# Patient Record
Sex: Male | Born: 1950
Health system: Southern US, Community
[De-identification: ages and names within clinical notes are randomized; demographics above are authoritative.]

## PROBLEM LIST (undated history)

## (undated) DIAGNOSIS — M1990 Unspecified osteoarthritis, unspecified site: Secondary | ICD-10-CM

## (undated) DIAGNOSIS — C801 Malignant (primary) neoplasm, unspecified: Secondary | ICD-10-CM

## (undated) DIAGNOSIS — I639 Cerebral infarction, unspecified: Secondary | ICD-10-CM

## (undated) DIAGNOSIS — H409 Unspecified glaucoma: Secondary | ICD-10-CM

## (undated) DIAGNOSIS — R911 Solitary pulmonary nodule: Secondary | ICD-10-CM

## (undated) DIAGNOSIS — G4733 Obstructive sleep apnea (adult) (pediatric): Secondary | ICD-10-CM

## (undated) DIAGNOSIS — I712 Thoracic aortic aneurysm, without rupture, unspecified: Secondary | ICD-10-CM

## (undated) DIAGNOSIS — C4491 Basal cell carcinoma of skin, unspecified: Secondary | ICD-10-CM

## (undated) DIAGNOSIS — I700 Atherosclerosis of aorta: Secondary | ICD-10-CM

## (undated) DIAGNOSIS — K759 Inflammatory liver disease, unspecified: Secondary | ICD-10-CM

## (undated) DIAGNOSIS — I10 Essential (primary) hypertension: Secondary | ICD-10-CM

## (undated) DIAGNOSIS — R5383 Other fatigue: Secondary | ICD-10-CM

## (undated) DIAGNOSIS — I2510 Atherosclerotic heart disease of native coronary artery without angina pectoris: Secondary | ICD-10-CM

## (undated) DIAGNOSIS — E785 Hyperlipidemia, unspecified: Secondary | ICD-10-CM

## (undated) DIAGNOSIS — I77810 Thoracic aortic ectasia: Secondary | ICD-10-CM

## (undated) DIAGNOSIS — I219 Acute myocardial infarction, unspecified: Secondary | ICD-10-CM

## (undated) DIAGNOSIS — Z8679 Personal history of other diseases of the circulatory system: Secondary | ICD-10-CM

## (undated) HISTORY — DX: Atherosclerosis of aorta: I70.0

## (undated) HISTORY — DX: Essential (primary) hypertension: I10

## (undated) HISTORY — PX: BASAL CELL CARCINOMA EXCISION: SHX1214

## (undated) HISTORY — DX: Inflammatory liver disease, unspecified: K75.9

## (undated) HISTORY — DX: Acute myocardial infarction, unspecified: I21.9

## (undated) HISTORY — DX: Basal cell carcinoma of skin, unspecified: C44.91

## (undated) HISTORY — DX: Hyperlipidemia, unspecified: E78.5

## (undated) HISTORY — DX: Unspecified glaucoma: H40.9

## (undated) HISTORY — DX: Solitary pulmonary nodule: R91.1

## (undated) HISTORY — DX: Thoracic aortic aneurysm, without rupture: I71.2

## (undated) HISTORY — DX: Other fatigue: R53.83

## (undated) HISTORY — DX: Atherosclerotic heart disease of native coronary artery without angina pectoris: I25.10

## (undated) HISTORY — DX: Obstructive sleep apnea (adult) (pediatric): G47.33

## (undated) HISTORY — DX: Thoracic aortic ectasia: I77.810

## (undated) HISTORY — DX: Thoracic aortic aneurysm, without rupture, unspecified: I71.20

## (undated) HISTORY — DX: Personal history of other diseases of the circulatory system: Z86.79

---

## 1982-12-04 HISTORY — PX: SEPTOPLASTY: SUR1290

## 1996-12-04 HISTORY — PX: UMBILICAL HERNIA REPAIR: SHX196

## 1999-06-02 ENCOUNTER — Encounter: Payer: Self-pay | Admitting: Pulmonary Disease

## 1999-06-02 ENCOUNTER — Ambulatory Visit: Admission: RE | Admit: 1999-06-02 | Discharge: 1999-06-02 | Payer: Self-pay | Admitting: Pulmonary Disease

## 1999-10-10 ENCOUNTER — Encounter: Admission: RE | Admit: 1999-10-10 | Discharge: 1999-10-10 | Payer: Self-pay | Admitting: Family Medicine

## 1999-10-10 ENCOUNTER — Encounter: Payer: Self-pay | Admitting: Family Medicine

## 2000-09-06 ENCOUNTER — Ambulatory Visit (HOSPITAL_COMMUNITY): Admission: RE | Admit: 2000-09-06 | Discharge: 2000-09-06 | Payer: Self-pay | Admitting: Urology

## 2000-09-06 ENCOUNTER — Encounter: Payer: Self-pay | Admitting: Urology

## 2000-09-11 ENCOUNTER — Encounter: Admission: RE | Admit: 2000-09-11 | Discharge: 2000-09-11 | Payer: Self-pay | Admitting: Urology

## 2000-09-11 ENCOUNTER — Encounter: Payer: Self-pay | Admitting: Urology

## 2003-08-26 ENCOUNTER — Observation Stay (HOSPITAL_COMMUNITY): Admission: RE | Admit: 2003-08-26 | Discharge: 2003-08-27 | Payer: Self-pay | Admitting: Surgery

## 2005-12-04 HISTORY — PX: CORONARY ANGIOPLASTY WITH STENT PLACEMENT: SHX49

## 2006-07-26 ENCOUNTER — Ambulatory Visit: Payer: Self-pay | Admitting: Cardiology

## 2006-08-01 ENCOUNTER — Ambulatory Visit: Payer: Self-pay | Admitting: Cardiology

## 2006-08-02 ENCOUNTER — Ambulatory Visit: Payer: Self-pay

## 2006-08-02 ENCOUNTER — Ambulatory Visit: Payer: Self-pay | Admitting: Cardiology

## 2006-08-09 ENCOUNTER — Ambulatory Visit: Payer: Self-pay | Admitting: Cardiology

## 2006-08-27 ENCOUNTER — Ambulatory Visit: Payer: Self-pay | Admitting: Cardiology

## 2006-08-30 ENCOUNTER — Ambulatory Visit: Payer: Self-pay | Admitting: Cardiology

## 2006-10-05 ENCOUNTER — Ambulatory Visit: Payer: Self-pay | Admitting: Cardiology

## 2007-01-09 ENCOUNTER — Ambulatory Visit: Payer: Self-pay | Admitting: Cardiology

## 2007-06-03 ENCOUNTER — Encounter: Payer: Self-pay | Admitting: Pulmonary Disease

## 2007-06-03 ENCOUNTER — Ambulatory Visit: Payer: Self-pay | Admitting: Cardiology

## 2007-08-23 ENCOUNTER — Ambulatory Visit: Payer: Self-pay | Admitting: Cardiology

## 2007-08-29 ENCOUNTER — Encounter: Payer: Self-pay | Admitting: Cardiology

## 2007-08-29 ENCOUNTER — Ambulatory Visit: Payer: Self-pay

## 2007-08-29 ENCOUNTER — Ambulatory Visit: Payer: Self-pay | Admitting: Cardiology

## 2007-10-22 ENCOUNTER — Ambulatory Visit: Payer: Self-pay | Admitting: Cardiology

## 2008-03-11 ENCOUNTER — Encounter: Admission: RE | Admit: 2008-03-11 | Discharge: 2008-03-11 | Payer: Self-pay | Admitting: Family Medicine

## 2008-03-26 ENCOUNTER — Encounter: Admission: RE | Admit: 2008-03-26 | Discharge: 2008-03-26 | Payer: Self-pay | Admitting: Family Medicine

## 2008-03-28 ENCOUNTER — Encounter: Payer: Self-pay | Admitting: Pulmonary Disease

## 2008-03-28 DIAGNOSIS — E785 Hyperlipidemia, unspecified: Secondary | ICD-10-CM | POA: Insufficient documentation

## 2008-03-28 DIAGNOSIS — K759 Inflammatory liver disease, unspecified: Secondary | ICD-10-CM | POA: Insufficient documentation

## 2008-03-28 DIAGNOSIS — I10 Essential (primary) hypertension: Secondary | ICD-10-CM | POA: Insufficient documentation

## 2008-03-28 DIAGNOSIS — G4733 Obstructive sleep apnea (adult) (pediatric): Secondary | ICD-10-CM | POA: Insufficient documentation

## 2008-03-31 ENCOUNTER — Encounter: Admission: RE | Admit: 2008-03-31 | Discharge: 2008-03-31 | Payer: Self-pay | Admitting: Family Medicine

## 2008-04-07 ENCOUNTER — Ambulatory Visit: Payer: Self-pay | Admitting: Pulmonary Disease

## 2008-04-07 DIAGNOSIS — I219 Acute myocardial infarction, unspecified: Secondary | ICD-10-CM | POA: Insufficient documentation

## 2008-04-29 ENCOUNTER — Ambulatory Visit: Payer: Self-pay

## 2008-05-09 ENCOUNTER — Encounter: Admission: RE | Admit: 2008-05-09 | Discharge: 2008-05-09 | Payer: Self-pay | Admitting: Neurosurgery

## 2009-03-30 ENCOUNTER — Encounter: Payer: Self-pay | Admitting: Cardiology

## 2009-04-06 ENCOUNTER — Ambulatory Visit: Payer: Self-pay | Admitting: Pulmonary Disease

## 2009-05-10 ENCOUNTER — Encounter: Payer: Self-pay | Admitting: Cardiology

## 2009-06-09 ENCOUNTER — Telehealth (INDEPENDENT_AMBULATORY_CARE_PROVIDER_SITE_OTHER): Payer: Self-pay | Admitting: *Deleted

## 2009-10-07 ENCOUNTER — Telehealth: Payer: Self-pay | Admitting: Cardiology

## 2009-10-07 ENCOUNTER — Encounter: Payer: Self-pay | Admitting: Cardiology

## 2009-10-08 ENCOUNTER — Telehealth (INDEPENDENT_AMBULATORY_CARE_PROVIDER_SITE_OTHER): Payer: Self-pay | Admitting: *Deleted

## 2009-10-11 ENCOUNTER — Ambulatory Visit: Payer: Self-pay | Admitting: Pulmonary Disease

## 2009-11-16 ENCOUNTER — Encounter: Payer: Self-pay | Admitting: Cardiology

## 2009-11-23 DIAGNOSIS — I2510 Atherosclerotic heart disease of native coronary artery without angina pectoris: Secondary | ICD-10-CM | POA: Insufficient documentation

## 2009-11-23 DIAGNOSIS — R5383 Other fatigue: Secondary | ICD-10-CM

## 2009-11-23 DIAGNOSIS — R5381 Other malaise: Secondary | ICD-10-CM | POA: Insufficient documentation

## 2009-11-24 DIAGNOSIS — Z8679 Personal history of other diseases of the circulatory system: Secondary | ICD-10-CM | POA: Insufficient documentation

## 2009-12-06 ENCOUNTER — Ambulatory Visit: Payer: Self-pay | Admitting: Cardiology

## 2010-04-07 ENCOUNTER — Telehealth: Payer: Self-pay | Admitting: Pulmonary Disease

## 2010-04-15 ENCOUNTER — Ambulatory Visit: Payer: Self-pay | Admitting: Pulmonary Disease

## 2010-06-15 ENCOUNTER — Telehealth (INDEPENDENT_AMBULATORY_CARE_PROVIDER_SITE_OTHER): Payer: Self-pay | Admitting: *Deleted

## 2010-06-16 ENCOUNTER — Encounter: Payer: Self-pay | Admitting: Cardiology

## 2010-07-22 ENCOUNTER — Ambulatory Visit: Payer: Self-pay | Admitting: Cardiology

## 2010-09-21 ENCOUNTER — Encounter: Payer: Self-pay | Admitting: Cardiology

## 2010-12-12 ENCOUNTER — Encounter
Admission: RE | Admit: 2010-12-12 | Discharge: 2010-12-12 | Payer: Self-pay | Source: Home / Self Care | Attending: Psychiatry | Admitting: Psychiatry

## 2010-12-25 ENCOUNTER — Encounter: Payer: Self-pay | Admitting: Neurosurgery

## 2010-12-25 ENCOUNTER — Encounter: Payer: Self-pay | Admitting: Orthopedic Surgery

## 2011-01-03 NOTE — Assessment & Plan Note (Signed)
Summary: f70m   Visit Type:  6 months follow up Primary Provider:  Dr Katy Fitch  CC:  Some palpitations in the morning most of the time.  History of Present Illness: Not having any chest pain.  Sometimes has some palpitations early in the morning, but infrequent.   Still not able to do a great deal at this point.  The neurologist told him not to do to much.  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  He has been restricted by the pain level in his back.  Still uses ibuprofen on a regular basis.  Patient no longer saying Ok Anis, but now seeing Dossie Arbour.     Current Medications (verified): 1)  Ibuprofen 200 Mg Tabs (Ibuprofen) .... Take 1 Tablet By Mouth Three Times A Day 2)  Aspirin 81 Mg Tbec (Aspirin) .... Take One Tablet By Mouth Daily 3)  Multivitamins  Tabs (Multiple Vitamin) .... Take 1 Tablet By Mouth Once A Day 4)  Eql Coq10 300 Mg Caps (Coenzyme Q10) .... Take 1 Tablet By Mouth Once A Day 5)  Fish Oil 1000 Mg Caps (Omega-3 Fatty Acids) .... Take 1 Capsule By Mouth Two Times A Day 6)  Triamterene-Hctz 37.5-25 Mg Tabs (Triamterene-Hctz) .... Take 1 Tablet By Mouth Once A Day 7)  Cyclobenzaprine Hcl 10 Mg Tabs (Cyclobenzaprine Hcl) .... Take 1 Tab By Mouth At Bedtime 8)  Tramadol Hcl 50 Mg Tabs (Tramadol Hcl) .... Take 1 Tablet By Mouth Three Times A Day As Needed  Allergies: 1)  ! Sulfa  Vital Signs:  Patient profile:   60 year old male Height:      74 inches Weight:      252.50 pounds BMI:     32.54 Pulse rate:   102 / minute Pulse rhythm:   irregular Resp:     18 per minute BP sitting:   116 / 96  (left arm) Cuff size:   large  Vitals Entered By: Vikki Ports (July 22, 2010 4:01 PM)  Physical Exam  General:  Well developed, well nourished, in no acute distress. Head:  normocephalic and atraumatic Eyes:  PERRLA/EOM intact; conjunctiva and lids normal. Neck:  Neck supple, no JVD. No masses, thyromegaly or abnormal cervical nodes. Lungs:  Clear bilaterally to  auscultation and percussion. Heart:  Non-displaced PMI, chest non-tender; regular rate and rhythm, S1, S2 without murmurs, rubs or gallops. Carotid upstroke normal, no bruit. Normal abdominal aortic size, no bruits.   EKG  Procedure date:  07/22/2010  Findings:      ST.  Otherwise normal.    Impression & Recommendations:  Problem # 1:  CAD (ICD-414.00) stable at present time.  No new symptoms.  HR slightly elevated, and patients wife will check. His updated medication list for this problem includes:    Aspirin 81 Mg Tbec (Aspirin) .Marland Kitchen... Take one tablet by mouth daily  Problem # 2:  HYPERTENSION (ICD-401.9) BP is borderline.  Wife should bheck this as well.  His updated medication list for this problem includes:    Aspirin 81 Mg Tbec (Aspirin) .Marland Kitchen... Take one tablet by mouth daily    Triamterene-hctz 37.5-25 Mg Tabs (Triamterene-hctz) .Marland Kitchen... Take 1 tablet by mouth once a day  Problem # 3:  HYPERLIPIDEMIA (ICD-272.4) Not on statins.  will recheck lipid and liver.  Problem # 4:  SLEEP APNEA, OBSTRUCTIVE, SEVERE (ICD-327.23) Wears CPAP machine and does not miss.

## 2011-01-03 NOTE — Letter (Signed)
Summary: Letter to Dr. Riley Kill from Patient  Letter to Dr. Riley Kill from Patient   Imported By: Marylou Mccoy 05/05/2010 09:47:23  _____________________________________________________________________  External Attachment:    Type:   Image     Comment:   External Document

## 2011-01-03 NOTE — Assessment & Plan Note (Signed)
Summary: Tyler Bradley for osa   Primary Provider/Referring Provider:  Dr Katy Fitch  CC:  Pt is here for a f/u appt.   Pt states he is wearing his cpap machine every night.  Approx 6 hours per night.  Pt denied any complaints with mask or pressure. Marland Kitchen  History of Present Illness: the pt comes in today for f/u of his known osa.  He is wearing cpap compliantly, and reports no mask or pressure issues.  He is seeing neurology for his back, and the pain is improved.  This has allowed him to get to sleep faster and longer while wearing cpap.  He feels he is resting better, and denies alertness issues during the day.  Current Medications (verified): 1)  Ibuprofen 200 Mg Tabs (Ibuprofen) .... Take 1 Tablet By Mouth Three Times A Day 2)  Aspirin 81 Mg Tbec (Aspirin) .... Take One Tablet By Mouth Daily 3)  Multivitamins  Tabs (Multiple Vitamin) .... Take 1 Tablet By Mouth Once A Day 4)  Eql Coq10 300 Mg Caps (Coenzyme Q10) .... Take 1 Tablet By Mouth Once A Day 5)  Fish Oil 1000 Mg Caps (Omega-3 Fatty Acids) .... Take 1 Capsule By Mouth Two Times A Day 6)  Triamterene-Hctz 37.5-25 Mg Tabs (Triamterene-Hctz) .... Take 1 Tablet By Mouth Once A Day 7)  Cyclobenzaprine Hcl 10 Mg Tabs (Cyclobenzaprine Hcl) .... Take 1 Tab By Mouth At Bedtime 8)  Tramadol Hcl 50 Mg Tabs (Tramadol Hcl) .... Take 1 Tablet By Mouth Three Times A Day As Needed  Allergies (verified): 1)  ! Sulfa  Review of Systems      See HPI  Vital Signs:  Patient profile:   60 year old male Height:      74 inches Weight:      255 pounds BMI:     32.86 O2 Sat:      94 % Temp:     97.6 degrees F oral Pulse rate:   89 / minute BP sitting:   112 / 78  (left arm) Cuff size:   large  Vitals Entered By: Arman Filter LPN (Apr 15, 2010 9:33 AM) CC: Pt is here for a f/u appt.   Pt states he is wearing his cpap machine every night.  Approx 6 hours per night.  Pt denied any complaints with mask or pressure.  Comments Medications reviewed with  patient Arman Filter LPN  Apr 15, 2010 9:33 AM    Physical Exam  General:  ow male in nad Nose:  no skin breakdown or pressure necrosis from cpap mask Neurologic:  alert, not sleepy, moves all 4.   Impression & Recommendations:  Problem # 1:  SLEEP APNEA, OBSTRUCTIVE, SEVERE (ICD-327.23)  the pt is doing well with cpap, and since his back pain is improving, he has lost weight and is sleeping longer.  I have asked him to continue on cpap, and to keep working on weight loss. Will see him back in one year.  Medications Added to Medication List This Visit: 1)  Ibuprofen 200 Mg Tabs (Ibuprofen) .... Take 1 tablet by mouth three times a day 2)  Eql Coq10 300 Mg Caps (Coenzyme q10) .... Take 1 tablet by mouth once a day 3)  Triamterene-hctz 37.5-25 Mg Tabs (Triamterene-hctz) .... Take 1 tablet by mouth once a day 4)  Cyclobenzaprine Hcl 10 Mg Tabs (Cyclobenzaprine hcl) .... Take 1 tab by mouth at bedtime 5)  Tramadol Hcl 50 Mg Tabs (Tramadol hcl) .... Take 1  tablet by mouth three times a day as needed  Other Orders: Est. Patient Level II ([address])  Patient Instructions: 1)  cancel all upcoming apptms 2)  schedule followup with me in one year 3)  continue to work on weight loss

## 2011-01-03 NOTE — Progress Notes (Signed)
Summary: rsc nos appt  Phone Note Call from Patient   Caller: [email or web address]  Call For: Tehilla Coffel Summary of Call: Pt rsc nos from 5/4 to 5/13 @ 9:45a. Initial call taken by: Darletta Moll,  Apr 07, 2010 9:53 AM

## 2011-01-03 NOTE — Assessment & Plan Note (Signed)
Summary: ROV   Visit Type:  Follow-up Primary Provider:  Dr Katy Fitch  CC:  edema lower extremities and sob.  History of Present Illness: Patient is struggling.  His biggest problem has been his back and hips.  When his back is tight, he feels it in legs and ankles.  Patient has had MRI, and both Dr. Sherlean Foot and Newell Coral indicated to him that he was not worse if his hips or back was worse.  Dr. Sherlean Foot, per patient, wanted the patient to have physical therapy.  HIs low back bothers him the most.  He has not been on statins.  He has had some lower extremity edema.  Patient denies any ongoing chest pain of any significance.  Has had some shortness of breath, but he has not been exercising or active by any means in last few months.  He wakes up at night, and has to reposition body.   Patient has gained about seven pounds.  Will use ibuprofen, but is taking on average about 4 tabs per day.  Was on simvastatin, but Dr. Duaine Dredge took him off of.  He was lethargic when he was on simvastatin.  BP high today here, but at home it has been quite a bit lower in general.  Wears CPAP every night at home.     Current Medications (verified): 1)  Ibuprofen 200 Mg Tabs (Ibuprofen) .... As Needed 2)  Aspirin 81 Mg Tbec (Aspirin) .... Take One Tablet By Mouth Daily 3)  Multivitamins  Tabs (Multiple Vitamin) .... Take 1 Tablet By Mouth Once A Day 4)  Coq10 100 Mg Caps (Coenzyme Q10) .... Take 1 Capsule By Mouth Twice A Day 5)  Fish Oil 1000 Mg Caps (Omega-3 Fatty Acids) .... Take 1 Capsule By Mouth Two Times A Day 6)  Betamethasone Dipropionate 0.05 % Crea (Betamethasone Dipropionate) .... Apply 1-2 Times Daily  Allergies: 1)  ! Sulfa  Past History:  Past Medical History: Last updated: 11/23/2009 Current Problems:  CAD (ICD-414.00) HYPERTENSION (ICD-401.9) HYPERLIPIDEMIA (ICD-272.4) Hx of MI (ICD-410.90) FATIGUE (ICD-780.79) HEPATITIS (ICD-573.3) SLEEP APNEA, OBSTRUCTIVE, SEVERE (ICD-327.23) RHEUMATIC  FEVER, HX OF (ICD-V12.59)  Past Surgical History: Last updated: 11/23/2009 BAsel Cell Carcinoma removed from forearm  Repair of umbilical hernia  Family History: Last updated: 11/23/2009 Cancer: Father ( Hodgkins disease) paternal grandmother (brain tumor)  Mother died at age 46- complications during surgery.  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 Head:  normocephalic and atraumatic Eyes:  PERRLA/EOM intact; conjunctiva and lids normal. Neck:  Neck supple, no JVD. No masses, thyromegaly or abnormal cervical nodes.  No bruits. Chest Wall:  no deformities or breast masses noted Lungs:  Clear bilaterally to auscultation and percussion. Heart:  PMI non displaced.  Normal S1 and S2.  No diastolic murmurs.   Abdomen:  Bowel sounds positive; abdomen soft and non-tender without masses, organomegaly, or hernias noted. No hepatosplenomegaly. Msk:  Back normal, normal gait. Muscle strength and tone normal. Pulses:  pulses normal in all  4 extremities Extremities:  No clubbing or cyanosis. Neurologic:  Alert and oriented x 3.   EKG  Procedure date:  12/06/2009  Findings:      NSR.  WNL. g Impression & Recommendations:  Problem # 1:  CAD (ICD-414.00) No definite cardiac symptoms.  No chest pain currently.  Continue to monitor status.  He is by and large disabled from a combination of his overall medical status, inability to be active, and current situation.  Cannot exercise at all, and does not feel he can work.  I am inclined to agree. His updated medication  list for this problem includes:    Aspirin 81 Mg Tbec (Aspirin) .Marland Kitchen... Take one tablet by mouth daily  Orders: EKG w/ Interpretation ([address])  Problem # 2:  HYPERTENSION (ICD-401.9) BP is quite high today.  He needs to check.  He is on no meds, has OSA, and is taking NSAIDS in fairly high dose which are contributors.  Reviewed.  If BP remains high, he will likely require treatment.  Ortho issues have rendered him unable to walk, or exercise much, and in significant pain requiring therapies.  Weight has risen.  Should be treated.  Edema may also be secondary to NSAIDs. His updated medication list for this problem includes:    Aspirin 81 Mg Tbec (Aspirin) .Marland Kitchen... Take one tablet by mouth daily  Orders: EKG w/ Interpretation ([address])  Problem # 3:  HYPERLIPIDEMIA (ICD-272.4) Not on statins.  Taking Fish oil.  Suggested he have a lipid and liver profile done.  He will call us back.  Problem # 4:  SLEEP APNEA, OBSTRUCTIVE, SEVERE (ICD-327.23) Under treatment.  Patient Instructions: 1)  Your physician wants you to follow-up in: 6 MONTHS.  You will receive a reminder letter in the mail two months in advance. If you don't receive a letter, please call our office to schedule the follow-up appointment. 2)  Your physician recommends that you continue on your current medications as directed. Please refer to the Current Medication list given to you today. 3)  Your physician recommends that you return for a FASTING lipid and liverprofile--414.01, 272.0, 782.3 (Pt will call back to schedule)

## 2011-01-03 NOTE — Letter (Signed)
Summary: [employer]   Imported By: Marylou Mccoy 05/05/2010 09:37:18  _____________________________________________________________________  External Attachment:    Type:   Image     Comment:   External Document

## 2011-01-03 NOTE — Progress Notes (Signed)
Summary: Patient's Current Med List   Patient's Current Med List   Imported By: Roderic Ovens 10/06/2010 10:42:29  _____________________________________________________________________  External Attachment:    Type:   Image     Comment:   External Document

## 2011-01-03 NOTE — Progress Notes (Signed)
  Request Recieved from [facility] for records sent to [facility]  June 15, 2010 1:24 PM

## 2011-04-14 ENCOUNTER — Ambulatory Visit: Payer: Self-pay | Admitting: Pulmonary Disease

## 2011-04-18 ENCOUNTER — Encounter: Payer: Self-pay | Admitting: *Deleted

## 2011-04-18 ENCOUNTER — Encounter: Payer: Self-pay | Admitting: Pulmonary Disease

## 2011-04-18 NOTE — Letter (Signed)
October 22, 2007    Mosetta Putt, M.D.  [address] [place], [place] [address]   RE:  Tyler Bradley, Tyler Bradley  MRN:  [id number]  /  DOB:  1951-08-08   Dear Cindee Lame:   I had the pleasure of seeing Tyler Bradley today in the office in a  followup visit.  In general he has been stable.  His wife has been  checking his blood pressures on a regular basis off of blood pressure  medication, and he has been consistently 120/70.  It has not been  elevated.  He is somewhat higher today, and initially his blood pressure  was 150/100, and then 150/90.  Of importance, the patient had a followup  echocardiogram which demonstrated well-preserved left ventricular  function without segmental wall motion abnormalities.  There was minimal  dilatation of the left atrium.  The aortic root size was also minimally  enlarged.   Today on exam the blood pressure is 150/90 by me, and the pulse is 75.  The lung fields are clear and the cardiac rhythm is regular.  I did not  appreciate aortic regurgitation.   The electrocardiogram is entirely within normal limits.   We plan not to see Tyler for 6 months.  I have asked him to keep a close  eye on his blood pressure.  We will get a chest x-ray as this has not  been done in the past.  Should he have any  problems in the interim, he is to give [place] a call.  I have strongly  encouraged him to try to lose weight, and he has promised me he will try  to get down to 240 during the interim period of time.   Thanks for allowing me to share in his care.    Sincerely,      Tyler Bradley. Tyler Kill, MD, [facility]  Electronically Signed    TDS/MedQ  DD: 10/22/2007  DT: 10/22/2007  Job #: [id number]

## 2011-04-18 NOTE — Assessment & Plan Note (Signed)
[facility]                            CARDIOLOGY OFFICE NOTE   NAME:SCHOOLFIELDJavante, Nilsson                MRN:          [id number]  DATE:08/23/2007                            DOB:          [birth year]    Mr. Mcphillips is in for followup.  In general, his major complaint is  that of fatigue.   INCOMPLETE DICTATION.     Arturo Morton. Riley Kill, MD, [facility]  Electronically Signed    TDS/MedQ  DD: 08/23/2007  DT: 08/23/2007  Job #: [id number]

## 2011-04-18 NOTE — Letter (Signed)
August 23, 2007    Mosetta Putt, M.D.  [address] [place], [place] [address]   RE:  Tyler Bradley  MRN:  [id number]  /  DOB:  05/12/1951   Dear Cindee Lame,   I had the pleasure of seeing Tyler Bradley in the office today in  followup.  In general, he has been  stable.  He has had some weight gain  despite staying active and according to him, following a diet relatively  clearly.  He wonders whether some of the medicines could be responsible  for the symptoms.  Otherwise, he has gotten along reasonably well.   MEDICATIONS:  1. Lipitor 40 mg 1/2 tablet daily.  2. Plavix 75 mg daily.  3. Aspirin 81 mg daily.  4. Metoprolol ER 50 mg 1/2 daily.  5. Multivitamin daily.  6. Coenzyme Q10 1 daily.  7. Fish oil 2 b.i.d.   PHYSICAL EXAMINATION:  He is alert and oriented.  Blood pressure 130/80, pulse 64.  Carotid upstrokes are brisk.  The lung fields are clear.  The PMI is nondisplaced.  There is a normal first and second heart sound  without significant murmur.   Review of recent laboratory studies have suggested a cholesterol of 155  with an LDL of 88 and an HDL of 50.  BUN and creatinine are normal.   The electrocardiogram here today demonstrates sinus bradycardia,  otherwise within normal limits.   I suspect that the beta blocker could be contributing to his fatigue.  We are going to get a 2D echocardiogram, and if his LV function is  reasonably preserved and normal, we will stop the beta blocker.  In  order to manage his hypertension, I may recommend that we add Losartan  to his regimen.  I will see him back in two months and send you a copy  of the echocardiogram once we have it.  I appreciate the opportunity of  sharing in his care.    Sincerely,      Tyler Bradley. Tyler Kill, MD, [facility]  Electronically Signed    TDS/MedQ  DD: 08/23/2007  DT: 08/23/2007  Job #: [id number]

## 2011-04-18 NOTE — Assessment & Plan Note (Signed)
[facility]                            CARDIOLOGY OFFICE NOTE   NAME:Tyler Bradley                MRN:          [id number]  DATE:06/03/2007                            DOB:          [birth year]    Tyler Bradley is in for a follow-up visit.  I had an extensive discussion with  both him and his wife today.  In general, he has been feeling relatively  well.  He denies any ongoing chest pain but the biggest problem is that  he has rather significant fatigue.  He just feels tired all of the time.  His wife says that at times his blood pressure is somewhat low, although  the pressures here have been consistently in a fairly normal range.  By  me today it was 140/90.   CURRENT MEDICATIONS:  1. Lipitor 40 mg one-half tablet daily.  2. Plavix 75 mg daily.  3. Aspirin 325 mg daily.  4. Metoprolol ER 50 mg daily.   PHYSICAL EXAMINATION:  VITAL SIGNS:  The blood pressure is 124/84 with a  pulse of 62.  LUNGS:  The lung fields are clear.  CARDIAC:  The cardiac rhythm is regular.   Electrocardiogram demonstrates normal sinus rhythm, within normal  limits.   IMPRESSION:  1. Coronary artery disease, status post implantation of a non-drug-      eluting stent during acute coronary syndrome.  2. Fatigue, possibly related to beta blockade therapy.  3. Hypercholesterolemia, on lipid-lowering therapy with last LDL      cholesterol of 45.   RECOMMENDATIONS:  1. Return to clinic in August.  2. At that time Plavix can be discontinued.  3. Decrease aspirin to 81 mg daily.  4. Decrease metoprolol ER to 50 mg one-half tablet daily.   He is to let us know if his symptoms do or do not improve.     Tyler Bradley. Riley Kill, MD, [facility]  Electronically Signed    TDS/MedQ  DD: 06/03/2007  DT: 06/04/2007  Job #: [id number]

## 2011-04-21 NOTE — Op Note (Signed)
   NAME:  Tyler Bradley, Tyler Bradley                   ACCOUNT NO.:  [id number]   MEDICAL RECORD NO.:  [id number]                   PATIENT TYPE:  AMB   LOCATION:  DAY                                  FACILITY:  [facility]   PHYSICIAN:  Thornton Park. Daphine Deutscher, M.D.             DATE OF BIRTH:  [birth year]   DATE OF PROCEDURE:  08/26/2003  DATE OF DISCHARGE:                                 OPERATIVE REPORT   PREOPERATIVE DIAGNOSIS:  Umbilical hernia.   POSTOPERATIVE DIAGNOSIS:  Umbilical hernia.   PROCEDURE:  Repair of umbilical hernia with mesh.   SURGEON:  Thornton Park. Daphine Deutscher, M.D.   ANESTHESIA:  General endotracheal.   DESCRIPTION OF PROCEDURE:  Tyler Bradley was taken to room 10, given  general anesthesia. The umbilical region was clipped and then prepped with  Betadine, draped sterilely. A small infraumbilical curvilinear incision was  used and I dissected the umbilical skin off this obvious umbilical hernia. I  opened it and cut into the sac and was able to reduce properitoneal fat and  then tuck this back beneath the fascia. A small Davol bard patch (cortex  Marlex) was then easily fit into the approximately 2 cm defect. This was  sutured to the perimeter with seven sutures of #0 Prolene. This completely  obliterated the defect and he did wake up and strain a bit and tested the  repair which looked to be in good order. The area was infiltrated with 1%  lidocaine Marcaine mixture and was then closed with 4-0 Vicryl  subcutaneously, subcuticularly and with Benzoin and Steri-Strips. The  patient seemed to tolerate the procedure well and was taken to the recovery  room in satisfactory condition.                                               Thornton Park Daphine Deutscher, M.D.    MBM/MEDQ  D:  08/26/2003  T:  08/26/2003  Job:  [id number]

## 2011-04-24 ENCOUNTER — Ambulatory Visit: Payer: Self-pay | Admitting: Pulmonary Disease

## 2011-04-27 ENCOUNTER — Encounter: Payer: Self-pay | Admitting: Pulmonary Disease

## 2011-05-08 ENCOUNTER — Ambulatory Visit (INDEPENDENT_AMBULATORY_CARE_PROVIDER_SITE_OTHER): Payer: PRIVATE HEALTH INSURANCE | Admitting: Pulmonary Disease

## 2011-05-08 ENCOUNTER — Encounter: Payer: Self-pay | Admitting: Pulmonary Disease

## 2011-05-08 ENCOUNTER — Telehealth: Payer: Self-pay | Admitting: Internal Medicine

## 2011-05-08 VITALS — BP 134/80 | HR 69 | Temp 97.7°F | Ht 74.0 in | Wt 263.4 lb

## 2011-05-08 DIAGNOSIS — G4733 Obstructive sleep apnea (adult) (pediatric): Secondary | ICD-10-CM

## 2011-05-08 NOTE — Patient Instructions (Signed)
Continue with cpap, and keep up with mask changes and supplies. Work on weight loss followup with me in one year.  

## 2011-05-08 NOTE — Telephone Encounter (Signed)
This message was created in error

## 2011-05-08 NOTE — Assessment & Plan Note (Signed)
The pt is wearing cpap compliantly, and feels it continues to help his sleep and daytime alertness.  He is due for a new mask, and I have also asked him to keep up with supplies as well.  I have also encouraged him to work on weight loss.

## 2011-05-08 NOTE — Progress Notes (Signed)
  Subjective:    Patient ID: Tyler Bradley, male    DOB: Mar 23, 1951, 60 y.o.   MRN: [id number]  HPI The pt comes in today for f/u of his known osa.  He is wearing cpap compliantly, and has no issues with mask fit or pressure.  He is overdue for a new mask though.  He feels he is sleeping well, and denies any significant daytime alertness issues with inactivity.  He has not been able to lose weight, and he feels this is due to inactivity from back issues.    Review of Systems  Constitutional: Positive for fever and unexpected weight change.  HENT: Positive for congestion, rhinorrhea, sneezing, postnasal drip and sinus pressure. Negative for ear pain, nosebleeds, sore throat, trouble swallowing and dental problem.   Eyes: Positive for redness and itching.  Respiratory: Negative for cough, chest tightness, shortness of breath and wheezing.   Cardiovascular: Positive for palpitations and leg swelling.  Gastrointestinal: Negative for nausea and vomiting.  Genitourinary: Negative for dysuria.  Musculoskeletal: Negative for joint swelling.  Skin: Positive for rash.  Neurological: Negative for headaches.  Hematological: Bruises/bleeds easily.  Psychiatric/Behavioral: Negative for dysphoric mood. The patient is not nervous/anxious.        Objective:   Physical Exam Ow male in nad No skin breakdown or pressure necrosis from cpap mask No purulence or discharge noted from nares LE without edema, no cyanosis noted.  Alert, does not appear sleepy, moves all 4        Assessment & Plan:

## 2011-07-19 ENCOUNTER — Other Ambulatory Visit: Payer: Self-pay | Admitting: Rheumatology

## 2011-07-19 DIAGNOSIS — M064 Inflammatory polyarthropathy: Secondary | ICD-10-CM

## 2011-07-20 ENCOUNTER — Encounter: Payer: Self-pay | Admitting: Cardiology

## 2011-07-20 ENCOUNTER — Ambulatory Visit (INDEPENDENT_AMBULATORY_CARE_PROVIDER_SITE_OTHER): Payer: PRIVATE HEALTH INSURANCE | Admitting: Cardiology

## 2011-07-20 VITALS — BP 134/89 | HR 89 | Ht 74.0 in | Wt 256.0 lb

## 2011-07-20 DIAGNOSIS — G4733 Obstructive sleep apnea (adult) (pediatric): Secondary | ICD-10-CM

## 2011-07-20 DIAGNOSIS — I2510 Atherosclerotic heart disease of native coronary artery without angina pectoris: Secondary | ICD-10-CM

## 2011-07-20 DIAGNOSIS — E785 Hyperlipidemia, unspecified: Secondary | ICD-10-CM

## 2011-07-20 NOTE — Patient Instructions (Signed)
Your physician recommends that you schedule a follow-up appointment in: 1 year  

## 2011-07-22 NOTE — Progress Notes (Signed)
HPI:  Tyler Bradley is in for follow up.  Cardiac wise, he has remained stable.  He has had a variety of other issues which have been problematic for him.  He gets some shortness of breath with light exercise, but that has been likely from a rduction in his overall activity level.  He has been seen by both neurology and rheumatology.  He has seen a back surgeon, and while they say they can repair a PARS defecrt, this would be unlikely to resolve the bigger issues.  Also just saw Dr. Marry Guan.  He does at times take a nonsteroidal.  Also of note, he uses CPAP machine every night.     Current Outpatient Prescriptions  Medication Sig Dispense Refill  . aspirin 81 MG tablet Take 81 mg by mouth daily.        . betamethasone dipropionate (DIPROLENE) 0.05 % cream Apply topically as needed.        . Coenzyme Q10 300 MG CAPS Take 100 mg by mouth 2 (two) times daily.       . cyclobenzaprine (FLEXERIL) 10 MG tablet Take 10 mg by mouth at bedtime as needed.       . fish oil-omega-3 fatty acids 1000 MG capsule Take 1 g by mouth 2 (two) times daily.        Marland Kitchen ibuprofen (ADVIL,MOTRIN) 200 MG tablet Take 750 mg by mouth as needed.       . metoprolol succinate (TOPROL-XL) 25 MG 24 hr tablet Take 25 mg by mouth daily.        . Multiple Vitamin (MULTIVITAMIN) capsule Take 1 capsule by mouth daily.        Marland Kitchen topiramate (TOPAMAX) 50 MG tablet Take 50 mg by mouth daily.        . traMADol (ULTRAM) 50 MG tablet Take 50 mg by mouth 3 (three) times daily as needed.        . triamterene-hydrochlorothiazide (MAXZIDE-25) 37.5-25 MG per tablet Take 1 tablet by mouth daily.          Allergies  Allergen Reactions  . Sulfonamide Derivatives     Past Medical History  Diagnosis Date  . CAD (coronary artery disease)   . HTN (hypertension)   . Hyperlipemia   . Heart attack   . Fatigue   . Hepatitis   . OSA (obstructive sleep apnea)   . History of rheumatic fever     Past Surgical History  Procedure Date  . Basal  cell carcinoma excision     from forehead  . Umbilical hernia repair     Family History  Problem Relation Age of Onset  . Hodgkin's lymphoma Father   . Other Paternal Grandmother     Brain tumor    History   Social History  . Marital Status: Married    Spouse Name: N/A    Number of Children: 2  . Years of Education: N/A   Occupational History  .      Benefits consultant   Social History Main Topics  . Smoking status: Never Smoker   . Smokeless tobacco: Not on file  . Alcohol Use: Not on file  . Drug Use: Not on file  . Sexually Active: Not on file   Other Topics Concern  . Not on file   Social History Narrative  . No narrative on file    ROS: Please see the HPI.  All other systems reviewed and negative.  PHYSICAL EXAM:  BP 134/89  Pulse  89  Ht 6\' 2"  (1.88 m)  Wt 256 lb (116.121 kg)  BMI 32.87 kg/m2  General: Well developed, well nourished, in no acute distress. Head:  Normocephalic and atraumatic. Neck: no JVD Lungs: Clear to auscultation and percussion. Heart: Normal S1 and S2.  No murmur, rubs or gallops.  Abdomen:  Normal bowel sounds; soft; non tender; no organomegaly Pulses: Pulses normal in all 4 extremities. Extremities: No clubbing or cyanosis. No edema. Neurologic: Alert and oriented x 3.  EKG:  NSR.  WNL.    ASSESSMENT AND PLAN:

## 2011-07-23 NOTE — Assessment & Plan Note (Signed)
On chronic CPAP therapy, and uses it.

## 2011-07-23 NOTE — Assessment & Plan Note (Signed)
Has been off of statin during the evaluation of all of his muscle and joint aches issues.

## 2011-07-23 NOTE — Assessment & Plan Note (Addendum)
No current symptoms.  Currently on ASA.  Please see [facility] for details.  It was received externally as his procedure was done elsewhere.  Notes extract into overview.  Prior records not in [facility].

## 2011-07-31 ENCOUNTER — Ambulatory Visit
Admission: RE | Admit: 2011-07-31 | Discharge: 2011-07-31 | Disposition: A | Discharge status: Home / Self Care | Payer: PRIVATE HEALTH INSURANCE | Source: Ambulatory Visit | Attending: Rheumatology | Admitting: Rheumatology

## 2011-07-31 DIAGNOSIS — M064 Inflammatory polyarthropathy: Secondary | ICD-10-CM

## 2011-07-31 MED ORDER — GADOBENATE DIMEGLUMINE 529 MG/ML IV SOLN
20.0000 mL | Freq: Once | INTRAVENOUS | Status: AC | PRN
  Administered 2011-07-31: 20 mL via INTRAVENOUS

## 2011-11-04 HISTORY — PX: TOTAL HIP ARTHROPLASTY: SHX124

## 2012-04-03 HISTORY — PX: TOTAL HIP ARTHROPLASTY: SHX124

## 2012-04-12 ENCOUNTER — Ambulatory Visit (HOSPITAL_COMMUNITY)
Admission: RE | Admit: 2012-04-12 | Discharge: 2012-04-12 | Disposition: A | Discharge status: Home / Self Care | Payer: PRIVATE HEALTH INSURANCE | Source: Ambulatory Visit | Attending: Internal Medicine | Admitting: Internal Medicine

## 2012-04-12 DIAGNOSIS — M79609 Pain in unspecified limb: Secondary | ICD-10-CM | POA: Insufficient documentation

## 2012-04-12 DIAGNOSIS — M7989 Other specified soft tissue disorders: Secondary | ICD-10-CM | POA: Insufficient documentation

## 2012-04-12 DIAGNOSIS — R609 Edema, unspecified: Secondary | ICD-10-CM

## 2012-04-12 NOTE — Progress Notes (Signed)
VASCULAR LAB PRELIMINARY  PRELIMINARY  PRELIMINARY  PRELIMINARY  Left lower extremity venous duplex completed.    Preliminary report:  Left:  No evidence of DVT, superficial thrombosis, or Baker's cyst.  Kely Dohn D, RVS 04/12/2012, 6:15 PM

## 2012-04-16 ENCOUNTER — Ambulatory Visit (INDEPENDENT_AMBULATORY_CARE_PROVIDER_SITE_OTHER): Payer: PRIVATE HEALTH INSURANCE | Admitting: Cardiology

## 2012-04-16 ENCOUNTER — Encounter: Payer: Self-pay | Admitting: Cardiology

## 2012-04-16 VITALS — BP 118/80 | HR 84 | Ht 74.0 in | Wt 249.0 lb

## 2012-04-16 DIAGNOSIS — I2510 Atherosclerotic heart disease of native coronary artery without angina pectoris: Secondary | ICD-10-CM

## 2012-04-16 DIAGNOSIS — I10 Essential (primary) hypertension: Secondary | ICD-10-CM

## 2012-04-16 DIAGNOSIS — E785 Hyperlipidemia, unspecified: Secondary | ICD-10-CM

## 2012-04-16 LAB — BASIC METABOLIC PANEL
CO2: 26 mEq/L (ref 19–32)
Chloride: 101 mEq/L (ref 96–112)
Potassium: 3.9 mEq/L (ref 3.5–5.1)
Sodium: 137 mEq/L (ref 135–145)

## 2012-04-16 NOTE — Progress Notes (Signed)
HPI:  The patient is seen back in followup. The patient underwent orthopedic surgery at [facility]. In December he had a right hip replacement and had an entirely uncomplicated course. He had a second replacement on May 2. The epidural did not work and he took oxycodone every 3 hours until he was discharged. He returned to the emergency room with a fever and was admitted with presumed pneumonia although they decided the next day that he did not have that.  His wife was concerned about possible stroke and had him see Dr. Eloise Harman on the following Monday. He also had no energy and was subsequently seen again his oxygen saturation was okay his blood count apparently was okay as well he was sent to the hospital for the ultrasound of his legs, and there was no evidence of DVT.  He has not had chest pain, shortness of breath with exertion. As such his wife wanted him checked. He denies any current chest pain.  Current Outpatient Prescriptions  Medication Sig Dispense Refill  . Acetaminophen (TYLENOL 8 HOUR PO) Take 650 mg by mouth 2 (two) times daily.      Marland Kitchen aspirin 325 MG tablet Take 325 mg by mouth 2 (two) times daily.      . betamethasone dipropionate (DIPROLENE) 0.05 % cream Apply topically as needed.        . Coenzyme Q10 300 MG CAPS Take 100 mg by mouth daily.       . cyclobenzaprine (FLEXERIL) 10 MG tablet Take 10 mg by mouth at bedtime as needed.       . fish oil-omega-3 fatty acids 1000 MG capsule Take 1 g by mouth 2 (two) times daily.        . metoprolol succinate (TOPROL-XL) 25 MG 24 hr tablet Take 25 mg by mouth daily.        . Multiple Vitamin (MULTIVITAMIN) capsule Take 1 capsule by mouth daily.        . traMADol (ULTRAM) 50 MG tablet Take 50 mg by mouth 3 (three) times daily as needed.        . triamterene-hydrochlorothiazide (MAXZIDE-25) 37.5-25 MG per tablet Take 1 tablet by mouth daily.          Allergies  Allergen Reactions  . Sulfonamide Derivatives   . Tape    Adhesive tape leaves a rash    Past Medical History  Diagnosis Date  . CAD (coronary artery disease)   . HTN (hypertension)   . Hyperlipemia   . Heart attack   . Fatigue   . Hepatitis   . OSA (obstructive sleep apnea)   . History of rheumatic fever     Past Surgical History  Procedure Date  . Basal cell carcinoma excision     from forehead  . Umbilical hernia repair     Family History  Problem Relation Age of Onset  . Hodgkin's lymphoma Father   . Other Paternal Grandmother     Brain tumor    History   Social History  . Marital Status: Married    Spouse Name: N/A    Number of Children: 2  . Years of Education: N/A   Occupational History  .      Benefits consultant   Social History Main Topics  . Smoking status: Never Smoker   . Smokeless tobacco: Not on file  . Alcohol Use: Not on file  . Drug Use: Not on file  . Sexually Active: Not on file  Other Topics Concern  . Not on file   Social History Narrative  . No narrative on file    ROS: Please see the HPI.  All other systems reviewed and negative.  PHYSICAL EXAM:  BP 118/80  Pulse 84  Ht 6\' 2"  (1.88 m)  Wt 249 lb (112.946 kg)  BMI 31.97 kg/m2  General: Well developed, well nourished, in no acute distress. Head:  Normocephalic and atraumatic. Neck: no JVD Lungs: Clear to auscultation and percussion. Heart: Normal S1 and S2.  No murmur, rubs or gallops.  Abdomen:  Normal bowel sounds; soft; non tender; no organomegaly Pulses: Pulses normal in all 4 extremities. Extremities: No clubbing or cyanosis. No edema. Neurologic: Alert and oriented x 3.  EKG:  NSR.  WNL.   ASSESSMENT AND PLAN:

## 2012-04-16 NOTE — Patient Instructions (Signed)
Your physician recommends that you schedule a follow-up appointment as needed.   Your physician recommends that you have lab work today: BMP

## 2012-04-16 NOTE — Assessment & Plan Note (Signed)
At the current time he appears to be stable.  He has no chest pain and his ECG is normal.  He likely has just had a longer course with this procedure, but I did explain to them the nature of inflammation mediated events such as MI.  He does not need further workup at present, and I have encouraged him to stay active.  His wife is having a double mastectomy next week, so they also wondered if this could impact him.  For now we will continue to watch him closely.  Should he have new symptoms he is to call me.

## 2012-04-16 NOTE — Assessment & Plan Note (Signed)
Followed by Dr. Paterson.   

## 2012-04-16 NOTE — Assessment & Plan Note (Signed)
Controlled, but will check bmet.

## 2012-05-07 ENCOUNTER — Ambulatory Visit: Payer: PRIVATE HEALTH INSURANCE | Admitting: Pulmonary Disease

## 2012-06-18 ENCOUNTER — Ambulatory Visit (HOSPITAL_COMMUNITY): Payer: PRIVATE HEALTH INSURANCE

## 2012-06-18 ENCOUNTER — Other Ambulatory Visit (HOSPITAL_COMMUNITY): Payer: Self-pay | Admitting: Psychiatry

## 2012-06-18 DIAGNOSIS — I639 Cerebral infarction, unspecified: Secondary | ICD-10-CM

## 2012-06-20 ENCOUNTER — Ambulatory Visit (HOSPITAL_COMMUNITY): Payer: PRIVATE HEALTH INSURANCE | Attending: Cardiovascular Disease | Admitting: Radiology

## 2012-06-20 ENCOUNTER — Other Ambulatory Visit (HOSPITAL_COMMUNITY): Payer: PRIVATE HEALTH INSURANCE

## 2012-06-20 ENCOUNTER — Encounter (HOSPITAL_COMMUNITY): Payer: Self-pay

## 2012-06-20 DIAGNOSIS — I6789 Other cerebrovascular disease: Secondary | ICD-10-CM | POA: Insufficient documentation

## 2012-06-20 DIAGNOSIS — E785 Hyperlipidemia, unspecified: Secondary | ICD-10-CM | POA: Insufficient documentation

## 2012-06-20 DIAGNOSIS — I639 Cerebral infarction, unspecified: Secondary | ICD-10-CM

## 2012-06-20 DIAGNOSIS — R5383 Other fatigue: Secondary | ICD-10-CM | POA: Insufficient documentation

## 2012-06-20 DIAGNOSIS — R0989 Other specified symptoms and signs involving the circulatory and respiratory systems: Secondary | ICD-10-CM | POA: Insufficient documentation

## 2012-06-20 DIAGNOSIS — R0609 Other forms of dyspnea: Secondary | ICD-10-CM | POA: Insufficient documentation

## 2012-06-20 DIAGNOSIS — I252 Old myocardial infarction: Secondary | ICD-10-CM | POA: Insufficient documentation

## 2012-06-20 DIAGNOSIS — R5381 Other malaise: Secondary | ICD-10-CM | POA: Insufficient documentation

## 2012-06-20 DIAGNOSIS — I517 Cardiomegaly: Secondary | ICD-10-CM | POA: Insufficient documentation

## 2012-06-20 DIAGNOSIS — I10 Essential (primary) hypertension: Secondary | ICD-10-CM | POA: Insufficient documentation

## 2012-06-20 DIAGNOSIS — I2510 Atherosclerotic heart disease of native coronary artery without angina pectoris: Secondary | ICD-10-CM | POA: Insufficient documentation

## 2012-06-20 NOTE — Progress Notes (Signed)
Echocardiogram performed.  

## 2012-06-20 NOTE — Progress Notes (Signed)
Patient ID: Tyler Bradley, male   DOB: 09/02/1951, 61 y.o.   MRN: [id number] IV with 20 G angiocath (R) hand x 1 by Archie Patten Yount,CNMT-RT-N tolerated well, site unremarkable for Echo Bubble Study. Irean Hong, RN

## 2012-06-21 ENCOUNTER — Encounter (HOSPITAL_COMMUNITY): Payer: Self-pay | Admitting: Psychiatry

## 2012-08-02 ENCOUNTER — Ambulatory Visit (INDEPENDENT_AMBULATORY_CARE_PROVIDER_SITE_OTHER): Payer: PRIVATE HEALTH INSURANCE | Admitting: Cardiology

## 2012-08-02 ENCOUNTER — Encounter: Payer: Self-pay | Admitting: Cardiology

## 2012-08-02 VITALS — BP 120/78 | HR 72 | Ht 74.0 in | Wt 240.0 lb

## 2012-08-02 DIAGNOSIS — E785 Hyperlipidemia, unspecified: Secondary | ICD-10-CM

## 2012-08-02 DIAGNOSIS — I2510 Atherosclerotic heart disease of native coronary artery without angina pectoris: Secondary | ICD-10-CM

## 2012-08-02 DIAGNOSIS — I10 Essential (primary) hypertension: Secondary | ICD-10-CM

## 2012-08-02 NOTE — Assessment & Plan Note (Signed)
The patient is well controlled.  He will need labs done at Dr. Worthy Flank office at the appopriate time.

## 2012-08-02 NOTE — Assessment & Plan Note (Signed)
Will defer to Dr. Eloise Harman.

## 2012-08-02 NOTE — Assessment & Plan Note (Signed)
Not currently having an cardiac symptoms.  Continue medical therapy is warranted.

## 2012-08-02 NOTE — Progress Notes (Signed)
HPI:  Patient is being seen in followup today. From a cardiac standpoint, he is doing well. He was thought to have a stroke. He was seen in his eye grounds. He was seen by a neurologist and carotid Dopplers were done. He also had an MRI. He has not had any significant chest pain. He has regular followup with Dr. Jarold Motto on an annual basis, and I suspect that his laboratory studies are done at that time.  The patient was also noted to have glaucoma, has been placed on medications for this. In addition, his dosing of aspirin was increased. He doesn't know any specific symptoms at this time.  Current Outpatient Prescriptions  Medication Sig Dispense Refill  . Acetaminophen (TYLENOL 8 HOUR PO) Take 650 mg by mouth 2 (two) times daily. As needed      . aspirin 325 MG tablet Take 325 mg by mouth daily.       . betamethasone dipropionate (DIPROLENE) 0.05 % cream Apply topically as needed.        . Coenzyme Q10 300 MG CAPS Take 300 mg by mouth daily.       . cyclobenzaprine (FLEXERIL) 10 MG tablet Take 10 mg by mouth at bedtime as needed.       . fish oil-omega-3 fatty acids 1000 MG capsule Take 1 g by mouth 2 (two) times daily.        . metoprolol succinate (TOPROL-XL) 25 MG 24 hr tablet Take 25 mg by mouth daily.        . Multiple Vitamin (MULTIVITAMIN) capsule Take 1 capsule by mouth daily.        . Multiple Vitamins-Minerals (ICAPS PO) Take by mouth daily.      . traMADol (ULTRAM) 50 MG tablet Take 50 mg by mouth 3 (three) times daily as needed.        . travoprost, benzalkonium, (TRAVATAN) 0.004 % ophthalmic solution 1 drop at bedtime.      . triamterene-hydrochlorothiazide (MAXZIDE-25) 37.5-25 MG per tablet Take 1 tablet by mouth daily.          Allergies  Allergen Reactions  . Sulfonamide Derivatives   . Tape     Adhesive tape leaves a rash    Past Medical History  Diagnosis Date  . CAD (coronary artery disease)   . HTN (hypertension)   . Hyperlipemia   . Heart attack   . Fatigue    . Hepatitis   . OSA (obstructive sleep apnea)   . History of rheumatic fever     Past Surgical History  Procedure Date  . Basal cell carcinoma excision     from forehead  . Umbilical hernia repair     Family History  Problem Relation Age of Onset  . Hodgkin's lymphoma Father   . Other Paternal Grandmother     Brain tumor    History   Social History  . Marital Status: Married    Spouse Name: N/A    Number of Children: 2  . Years of Education: N/A   Occupational History  .      Benefits consultant   Social History Main Topics  . Smoking status: Never Smoker   . Smokeless tobacco: Not on file  . Alcohol Use: Not on file  . Drug Use: Not on file  . Sexually Active: Not on file   Other Topics Concern  . Not on file   Social History Narrative  . No narrative on file    ROS: Please  see the HPI.  All other systems reviewed and negative.  PHYSICAL EXAM:  BP 120/78  Pulse 72  Ht 6\' 2"  (1.88 m)  Wt 240 lb (108.863 kg)  BMI 30.81 kg/m2  General: Well developed, well nourished, in no acute distress. Head:  Normocephalic and atraumatic. Neck: no JVD.  No carotid bruits.   Lungs: Clear to auscultation and percussion. Heart: Normal S1 and S2.  No murmur, rubs or gallops.  Pulses: Pulses normal in all 4 extremities. Extremities: No clubbing or cyanosis. No edema. Neurologic: Alert and oriented x 3.  EKG:  NSR.  WNL.   ASSESSMENT AND PLAN:

## 2012-08-02 NOTE — Patient Instructions (Signed)
Your physician wants you to follow-up in: 6 MONTHS with Dr Stuckey.  You will receive a reminder letter in the mail two months in advance. If you don't receive a letter, please call our office to schedule the follow-up appointment.  Your physician recommends that you continue on your current medications as directed. Please refer to the Current Medication list given to you today.  

## 2013-01-27 ENCOUNTER — Ambulatory Visit (INDEPENDENT_AMBULATORY_CARE_PROVIDER_SITE_OTHER): Payer: PRIVATE HEALTH INSURANCE | Admitting: Cardiology

## 2013-01-27 ENCOUNTER — Encounter: Payer: Self-pay | Admitting: Cardiology

## 2013-01-27 VITALS — BP 110/86 | HR 64 | Ht 74.0 in | Wt 244.0 lb

## 2013-01-27 DIAGNOSIS — I2510 Atherosclerotic heart disease of native coronary artery without angina pectoris: Secondary | ICD-10-CM

## 2013-01-27 DIAGNOSIS — E785 Hyperlipidemia, unspecified: Secondary | ICD-10-CM

## 2013-01-27 DIAGNOSIS — I10 Essential (primary) hypertension: Secondary | ICD-10-CM

## 2013-01-27 LAB — LIPID PANEL
Cholesterol: 178 mg/dL (ref 0–200)
LDL Cholesterol: 107 mg/dL — ABNORMAL HIGH (ref 0–99)
Triglycerides: 154 mg/dL — ABNORMAL HIGH (ref 0.0–149.0)
VLDL: 30.8 mg/dL (ref 0.0–40.0)

## 2013-01-27 LAB — BASIC METABOLIC PANEL
BUN: 20 mg/dL (ref 6–23)
CO2: 30 mEq/L (ref 19–32)
Chloride: 102 mEq/L (ref 96–112)
Potassium: 3.7 mEq/L (ref 3.5–5.1)

## 2013-01-27 NOTE — Patient Instructions (Addendum)
Your physician wants you to follow-up in: 1 YEAR with Dr Excell Seltzer (previous pt of Dr Riley Kill). You will receive a reminder letter in the mail two months in advance. If you don't receive a letter, please call our office to schedule the follow-up appointment.  Your physician recommends that you have lab work today: LIPID and BMP  Your physician recommends that you continue on your current medications as directed. Please refer to the Current Medication list given to you today.

## 2013-01-28 NOTE — Assessment & Plan Note (Addendum)
No current symptoms and he seems to be doing well.  Continued medical therapy is warranted.  ASA dose was selected by neuro after thought of stroke.  Notably, no history or evidence of atrial fib.

## 2013-01-28 NOTE — Assessment & Plan Note (Signed)
We discussed options with regard to his BP meds.  He is stable at present.  If he wanted to stop one of the two, the best candidate would be his diuretic, and continue Beta blockers.  He should measure BP and if it goes up, then consider seriously the possibility of resuming his meds.  Desire for beta blockade discussed.

## 2013-01-28 NOTE — Assessment & Plan Note (Signed)
Unable to take statins.  Weight loss would be a good consideration for risk factor reduction.

## 2013-01-28 NOTE — Progress Notes (Signed)
HPI:  Overall he seems to be doing a whole lot better.  No chest pain or other major symptoms.  Mobility has improved dramatically with hip replacement.  Denies any major complaints.    Current Outpatient Prescriptions  Medication Sig Dispense Refill  . Acetaminophen (TYLENOL 8 HOUR PO) Take 650 mg by mouth 2 (two) times daily. As needed      . aspirin 325 MG tablet Take 325 mg by mouth daily.       . betamethasone dipropionate (DIPROLENE) 0.05 % cream Apply topically as needed.        . Cholecalciferol (VITAMIN D3) 5000 UNITS CAPS Take 5,000 Units by mouth daily.      . Coenzyme Q10 300 MG CAPS Take 300 mg by mouth daily.       Marland Kitchen etodolac (LODINE) 500 MG tablet Take 500 mg by mouth as needed.      . ferrous sulfate 325 (65 FE) MG tablet Take 325 mg by mouth daily with breakfast.      . fish oil-omega-3 fatty acids 1000 MG capsule Take 1 g by mouth 2 (two) times daily.        . meloxicam (MOBIC) 15 MG tablet USE AS NEEDED      . metoprolol succinate (TOPROL-XL) 25 MG 24 hr tablet Take 25 mg by mouth daily.        . Multiple Vitamin (MULTIVITAMIN) capsule Take 1 capsule by mouth daily.        . Multiple Vitamins-Minerals (ICAPS PO) Take by mouth daily.      . OMEGA-3 KRILL OIL PO Take by mouth. TAKE ONE CAPSULE BY MOUTH DAILY      . travoprost, benzalkonium, (TRAVATAN) 0.004 % ophthalmic solution 1 drop at bedtime.      . triamterene-hydrochlorothiazide (MAXZIDE-25) 37.5-25 MG per tablet Take 1 tablet by mouth daily.        . Vitamin D-Vitamin K (VITAMIN K2-VITAMIN D3 PO) TAKE ONE TABLET OF VITAMIN K-2, MENAQUINONE-7 50 MCG       No current facility-administered medications for this visit.    Allergies  Allergen Reactions  . Sulfonamide Derivatives   . Tape     Adhesive tape leaves a rash    Past Medical History  Diagnosis Date  . CAD (coronary artery disease)   . HTN (hypertension)   . Hyperlipemia   . Heart attack   . Fatigue   . Hepatitis   . OSA (obstructive sleep apnea)    . History of rheumatic fever     Past Surgical History  Procedure Laterality Date  . Basal cell carcinoma excision      from forehead  . Umbilical hernia repair      Family History  Problem Relation Age of Onset  . Hodgkin's lymphoma Father   . Other Paternal Grandmother     Brain tumor    History   Social History  . Marital Status: Married    Spouse Name: N/A    Number of Children: 2  . Years of Education: N/A   Occupational History  .      Benefits consultant   Social History Main Topics  . Smoking status: Never Smoker   . Smokeless tobacco: Not on file  . Alcohol Use: Not on file  . Drug Use: Not on file  . Sexually Active: Not on file   Other Topics Concern  . Not on file   Social History Narrative  . No narrative on file  ROS: Please see the HPI.  All other systems reviewed and negative.  PHYSICAL EXAM:  BP 110/86  Pulse 64  Ht 6\' 2"  (1.88 m)  Wt 244 lb (110.678 kg)  BMI 31.31 kg/m2  SpO2 96%  General: Well developed, well nourished, in no acute distress. Head:  Normocephalic and atraumatic. Neck: no JVD Lungs: Clear to auscultation and percussion. Heart: Normal S1 and S2.  No murmur, rubs or gallops.  Pulses: Pulses normal in all 4 extremities. Extremities: No clubbing or cyanosis. No edema. Neurologic: Alert and oriented x 3.  EKG:  NSR.  WNL.   ASSESSMENT AND PLAN:

## 2013-03-03 ENCOUNTER — Other Ambulatory Visit: Payer: Self-pay | Admitting: Internal Medicine

## 2013-03-03 DIAGNOSIS — M545 Low back pain, unspecified: Secondary | ICD-10-CM

## 2013-03-10 ENCOUNTER — Ambulatory Visit
Admission: RE | Admit: 2013-03-10 | Discharge: 2013-03-10 | Disposition: A | Discharge status: Home / Self Care | Payer: BC Managed Care – PPO | Source: Ambulatory Visit | Attending: Internal Medicine | Admitting: Internal Medicine

## 2013-03-10 DIAGNOSIS — M545 Low back pain, unspecified: Secondary | ICD-10-CM

## 2013-08-14 ENCOUNTER — Telehealth: Payer: Self-pay | Admitting: Cardiovascular Disease

## 2013-08-14 NOTE — Telephone Encounter (Signed)
Called patient's wife. She states that he has been complaining of numbness and tingling in his fingers on and off times several weeks. No other complaints. States that he has been doing a lot of typing lately without the use of an arm rest. Advised her to have him use a gel arm rest pad and if not better to see his PCP for this problem.  Patient's wife agreed to this plan.

## 2013-08-14 NOTE — Telephone Encounter (Signed)
Just told me he is having tingling in both arms and fingers.  He has a stent.

## 2014-01-15 ENCOUNTER — Encounter: Payer: Self-pay | Admitting: Cardiovascular Disease

## 2014-01-15 ENCOUNTER — Ambulatory Visit (INDEPENDENT_AMBULATORY_CARE_PROVIDER_SITE_OTHER): Payer: BC Managed Care – PPO | Admitting: Cardiovascular Disease

## 2014-01-15 VITALS — BP 140/89 | HR 83 | Ht 74.0 in | Wt 264.0 lb

## 2014-01-15 DIAGNOSIS — E785 Hyperlipidemia, unspecified: Secondary | ICD-10-CM

## 2014-01-15 DIAGNOSIS — I10 Essential (primary) hypertension: Secondary | ICD-10-CM

## 2014-01-15 DIAGNOSIS — I2510 Atherosclerotic heart disease of native coronary artery without angina pectoris: Secondary | ICD-10-CM

## 2014-01-15 MED ORDER — EZETIMIBE 10 MG PO TABS: 10.0000 mg | ORAL_TABLET | Freq: Every day | ORAL | Status: DC

## 2014-01-15 NOTE — Addendum Note (Signed)
Addended by: Emmaline Life on: 01/15/2014 05:48 PM   Modules accepted: Orders

## 2014-01-15 NOTE — Progress Notes (Signed)
HPI:  63 year old gentleman presenting for followup evaluation. He has been followed by Dr. Lia Foyer. This is his first encounter with me.  The patient has coronary artery disease. He presented in 2007 with acute coronary syndrome. He underwent cardiac catheterization in [facility]. At that time he was treated with overlapping bare-metal stents in his right coronary artery. He has his stent card with him and they were 4.0 mm MultiLink vision stents.  From a cardiac perspective the patient is doing well. He denies chest pain, chest pressure, shortness of breath, edema, or palpitations.  He is primarily limited by arthritis. The patient has had 2 hip replacements. Recently he has been complaining of joint swelling and pain as well as rash. He is concerned that he may have psoriatic arthritis. He is going to see his rheumatologist in the near future. The patient has been contemplating back surgery. He has concerns because he had a stroke after her hip replacement in 2013. He has no residual deficit.  His last lipids were one year ago and LDL was 107. He's been statin intolerant. He's only taking fish oil at this time.  Outpatient Encounter Prescriptions as of 01/15/2014  Medication Sig  . aspirin 325 MG tablet Take 325 mg by mouth daily.   . betamethasone dipropionate (DIPROLENE) 0.05 % cream Apply topically as needed.    . Cholecalciferol (VITAMIN D3) 5000 UNITS CAPS Take 5,000 Units by mouth daily.  . Coenzyme Q10 300 MG CAPS Take 300 mg by mouth 2 (two) times daily.   Marland Kitchen etodolac (LODINE) 500 MG tablet Take 500 mg by mouth as needed.  . ferrous sulfate 325 (65 FE) MG tablet Take 325 mg by mouth daily with breakfast.  . fish oil-omega-3 fatty acids 1000 MG capsule Take 1 g by mouth daily.   Marland Kitchen latanoprost (XALATAN) 0.005 % ophthalmic solution Place 1 drop into both eyes at bedtime.  . metoprolol succinate (TOPROL-XL) 25 MG 24 hr tablet Take 25 mg by mouth daily.    . Multiple  Vitamin (MULTIVITAMIN) capsule Take 1 capsule by mouth daily.    . Multiple Vitamins-Minerals (ICAPS PO) Take by mouth daily.  . OMEGA-3 KRILL OIL PO Take by mouth. TAKE ONE CAPSULE BY MOUTH DAILY  . topiramate (TOPAMAX) 50 MG tablet Take 50 mg by mouth daily.  . travoprost, benzalkonium, (TRAVATAN) 0.004 % ophthalmic solution 1 drop at bedtime.  . triamterene-hydrochlorothiazide (MAXZIDE-25) 37.5-25 MG per tablet Take 1 tablet by mouth daily.    Marland Kitchen ezetimibe (ZETIA) 10 MG tablet Take 1 tablet (10 mg total) by mouth daily.  . Vitamin D-Vitamin K (VITAMIN K2-VITAMIN D3 PO) TAKE ONE TABLET OF VITAMIN K-2, MENAQUINONE-7 50 MCG  . [DISCONTINUED] Acetaminophen (TYLENOL 8 HOUR PO) Take 650 mg by mouth 2 (two) times daily. As needed  . [DISCONTINUED] meloxicam (MOBIC) 15 MG tablet USE AS NEEDED    Allergies  Allergen Reactions  . Sulfonamide Derivatives   . Tape     Adhesive tape leaves a rash    Past Medical History  Diagnosis Date  . CAD (coronary artery disease)   . HTN (hypertension)   . Hyperlipemia   . Heart attack   . Fatigue   . Hepatitis   . OSA (obstructive sleep apnea)   . History of rheumatic fever     ROS: Negative except as per HPI  BP 140/89  Pulse 83  Ht 6\' 2"  (1.88 m)  Wt 264 lb (119.75 kg)  BMI 33.88 kg/m2  PHYSICAL EXAM: Pt is alert and oriented, pleasant overweight male in NAD HEENT: normal Neck: JVP - normal, carotids 2+= without bruits Lungs: CTA bilaterally CV: RRR without murmur or gallop Abd: soft, NT, Positive BS, no hepatomegaly Ext: no C/C/E, distal pulses intact and equal Skin: warm/dry no rash  EKG:  Normal sinus rhythm 86 beats per minute, moderate voltage criteria for LVH maybe normal variant.  ASSESSMENT AND PLAN: 1. Coronary artery disease, native vessel. The patient is stable without symptoms of angina. He takes aspirin and a beta blocker. He is on a lipid-lowering therapy. I will see him back in one year. I think his overall cardiac risk  of back surgery is low considering no anginal symptoms. Stress testing is not indicated.  2. Hypertension. Blood pressure is controlled on a combination of metoprolol succinate and triamterene/hydrochlorothiazide.  3. Hyperlipidemia. Lipids from one year ago were reviewed. His LDL is above goal. Recommended adding zetia 10 mg daily with followup lipids and LFTs in 3 months.  For followup I will see him back in one year unless problems arise.  Sherren Mocha 01/15/2014 5:40 PM

## 2014-01-15 NOTE — Patient Instructions (Addendum)
Your physician has recommended you make the following change in your medication:  START Zetia 10 mg Once Daily at bedtime  Your physician recommends that you return for lab work in: 3 months for lipid and liver panel  Your physician wants you to follow-up in: 1 year with Dr. Burt Knack.  You will receive a reminder letter in the mail two months in advance. If you don't receive a letter, please call our office to schedule the follow-up appointment.

## 2014-01-21 ENCOUNTER — Telehealth: Payer: Self-pay | Admitting: Nurse Practitioner

## 2014-01-21 NOTE — Telephone Encounter (Signed)
Spoke with patient and patient is agreeable to plan.  I scheduled him for lipid clinic on 2/25.

## 2014-01-21 NOTE — Telephone Encounter (Signed)
Recommend referral to the Lipid Clinic. Please offer my apology - had no idea Zetia would cost anywhere near that amount. thx

## 2014-01-21 NOTE — Telephone Encounter (Signed)
Called patient to schedule 3 month follow-up lab work since patient would be starting Zetia per ov 2/12 with Dr. Burt Knack.  Patient states that he went to pick up the Rx and the cost was $600.  Patient would like to know if there is a different medication that he can take in the place.  I advised patient that I will send message to Dr. Burt Knack for advice and will call him back with that information.  Patient verbalized understanding and agreement.

## 2014-01-28 ENCOUNTER — Ambulatory Visit: Payer: BC Managed Care – PPO | Admitting: Pharmacist

## 2014-02-03 ENCOUNTER — Ambulatory Visit (INDEPENDENT_AMBULATORY_CARE_PROVIDER_SITE_OTHER): Payer: BC Managed Care – PPO | Admitting: Pharmacist

## 2014-02-03 VITALS — Wt 264.0 lb

## 2014-02-03 DIAGNOSIS — Z79899 Other long term (current) drug therapy: Secondary | ICD-10-CM

## 2014-02-03 DIAGNOSIS — E785 Hyperlipidemia, unspecified: Secondary | ICD-10-CM

## 2014-02-03 MED ORDER — ROSUVASTATIN CALCIUM 10 MG PO TABS: ORAL_TABLET | ORAL | Status: DC

## 2014-02-03 NOTE — Progress Notes (Signed)
Patient is a pleasant 63 y.o. WM referred to [facility]id Clinic by Dr. Burt Knack given h/o statin failure in the setting of CAD.  LDL was 107 mg/dL in 01/2013 (last year) not on therapy.  Patient isn't positive which statins he has used, but he is pretty sure he took/failed both atorvastatin and simvastatin due to joint pain.  He can't remember if he took pravastatin, and he doesn't think he ever tried Crestor.  Zetia was too expensive for him to try.  Patient sees Dr. Trudie Reed for chronic arthritis (? RA).  He does tell me his legs periodically get weak, and he has to grab a wall or sit down so he doesn't fall.  He is working out at [employer] trying to strengthen them.He has had two hip replacements a few years ago, and he is in significantly less pain since having these procedures done.  All of patient's prior statin use was prior to having his hip replacement procedures.  In 2007 patient was on vacation in [place], and had MI requiring PCI x 1.  No further heart issues since then according to patient.  Patient was found to have Hepatitis A at age 1.  [place].  In 2012, his hepatitis screen was negative for Hep A, B, and C.  He had a severe hepatic reaction with elevated LFTs on Setpra DS in the past.  He currently tells me he has a rash all over his body that comes and goes for the past few years.  He is curious if it could be due to sulfa again.  He is on HCTZ and ferrous sulfate currently.    RF:  CAD, HTN, CVA (following ortho surgery), age - LDL goal < 70, non-HDL < 100 at least Meds:  Fish oil 2 g/d, Co-Q 10 600 mg daily. Intolerant:  Failed multiple statins due to joint pain.  He thinks atoravastatin and simvastatin were the only two.  He states he will bring me a list of previously failed medications, in the near future.  Family history:  Not significant for heart disease Social history:  Drinks 1 glass of wine per night.  Non-smoker. Diet:  Eats greek yogurt, oatmeal,  or eggs/toast in the mornings.  Typically eats 4 eggs per week.  Lunch is typically largest meal of the day and is cooked by his wife.  Sandwich and beans or vegetable is typical for lunch.  He eats out a few days of the week for lunch, but typically eats a salad or healthy bagel if out.  Dinner is the smaller of his meals and is often a salad.  Drinks lots of water.  Avoids soda.  Drinks 1-2 glasses of tea he brews at home daily.  Rarely eats desserts.  He does eat assorted nuts sometimes for a snack or after dinner.   Exercise:  Goes to gym 3 days a week and uses recumbent bike.  He can't do too much exercise given his back.  He has considered back surgery, but not ready to move forward with this yet.  Labs: 01/2013 TC 178, TG 154, LDL 107, HDL 40 (not on lipid lowering therapy). 06/2012 - LFTs normal 07/2011 - h/o elevated CRP (Dr. Trudie Reed) - 4.5  Current Outpatient Prescriptions  Medication Sig Dispense Refill  . aspirin 325 MG tablet Take 325 mg by mouth daily.       . betamethasone dipropionate (DIPROLENE) 0.05 % cream Apply topically as needed.        Marland Kitchen  Cholecalciferol (VITAMIN D3) 5000 UNITS CAPS Take 5,000 Units by mouth daily.      . Coenzyme Q10 300 MG CAPS Take 300 mg by mouth 2 (two) times daily.       Marland Kitchen etodolac (LODINE) 500 MG tablet Take 500 mg by mouth as needed.      . ferrous sulfate 325 (65 FE) MG tablet Take 325 mg by mouth daily with breakfast.      . fish oil-omega-3 fatty acids 1000 MG capsule Take 1 g by mouth daily.       Marland Kitchen latanoprost (XALATAN) 0.005 % ophthalmic solution Place 1 drop into both eyes at bedtime.      . metoprolol succinate (TOPROL-XL) 25 MG 24 hr tablet Take 25 mg by mouth daily.        . OMEGA-3 KRILL OIL PO Take by mouth. TAKE ONE CAPSULE BY MOUTH DAILY      . rosuvastatin (CRESTOR) 10 MG tablet Take 1 tablet twice weekly  90 tablet  3  . topiramate (TOPAMAX) 50 MG tablet Take 50 mg by mouth daily.      . travoprost, benzalkonium, (TRAVATAN) 0.004 %  ophthalmic solution 1 drop at bedtime.      . triamterene-hydrochlorothiazide (MAXZIDE-25) 37.5-25 MG per tablet Take 1 tablet by mouth daily.        . Vitamin D-Vitamin K (VITAMIN K2-VITAMIN D3 PO) TAKE ONE TABLET OF VITAMIN K-2, MENAQUINONE-7 50 MCG       No current facility-administered medications for this visit.   Allergies  Allergen Reactions  . Sulfonamide Derivatives   . Tape     Adhesive tape leaves a rash   Family History  Problem Relation Age of Onset  . Hodgkin's lymphoma Father   . Other Paternal Grandmother     Brain tumor

## 2014-02-03 NOTE — Patient Instructions (Signed)
1.  Start Crestor 10 mg - once weekly for 4 weeks.  Then increase to twice weekly after 4 weeks. 2.  Try to hold Triamterene/HCTZ and iron supplement for 3-4 days and see if rash gets any better.  Monitor blood pressure and call us if top number gets to > 160 call Ysidro Evert [phone]). 3.  Restart diuretic and iron after 4 days and if rash gets worse again call Ysidro Evert back. 4.  Limit eggs to 2 eggs per week.  Okay to eat 4-5 eggs weekly if eating egg whites. 5.  Continue to go to the gym 3 days per week. 6.  Recheck cholesterol in 8 weeks (fasting lab on 04/13/14 AM), and see Ysidro Evert 1 day later (04/14/14 at 10:30 am).

## 2014-02-03 NOTE — Assessment & Plan Note (Addendum)
Will start patient slowly on Crestor 1-2 times per week given his h/o failing statins, and periodic muscle weakness in legs, and chronic arthritis.  Patient agreeable to this, and will recheck blood work 8 weeks after starting this.  Patient to reduce his egg intake to no more than 2 eggs per week, or change to egg whites.  He also agrees to increase almonds in diet for a snack given their LDL lowering and energy benefit.  Continue co-Q 10 and vitamin D in hopes of preventing any aches from statin use.   If can't tolerate 1-2 times per week Crestor, could consider pravastatin possibly.  We also discussed PCSK-9 inhibitor as a last option if we can't get him on statin.  He was open to this.  Regarding his rash on his body, patient will see if this improves after holding HCTZ/triamterene and iron sulfate for 3-4 days.  He will call if SBP goes up to > 160 mmHg.  He will let us know if rash improves off medication, and worsens after restarting it.  The sulfur from this medications could possibly be leading to his rash which has been around for "a very long time".  Patient given my number to call back.   Plan: 1.  Start Crestor 10 mg - once weekly for 4 weeks.  Then increase to twice weekly after 4 weeks. 2.  Try to hold Triamterene/HCTZ and iron supplement for 3-4 days and see if rash gets any better.  Monitor blood pressure and call us if top number gets to > 160 call Ysidro Evert [phone]). 3.  Restart diuretic and iron after 4 days and if rash gets worse again call Ysidro Evert back. 4.  Limit eggs to 2 eggs per week.  Okay to eat 4-5 eggs weekly if eating egg whites. 5.  Continue to go to the gym 3 days per week. 6.  Recheck cholesterol in 8 weeks (fasting lab on 04/13/14 AM), and see Ysidro Evert 1 day later (04/14/14 at 10:30 am).

## 2014-03-25 ENCOUNTER — Encounter: Payer: Self-pay | Admitting: Gastroenterology

## 2014-04-13 ENCOUNTER — Other Ambulatory Visit: Payer: BC Managed Care – PPO

## 2014-04-14 ENCOUNTER — Telehealth: Payer: Self-pay | Admitting: Pharmacist

## 2014-04-14 ENCOUNTER — Ambulatory Visit: Payer: BC Managed Care – PPO | Admitting: Pharmacist

## 2014-04-14 NOTE — Telephone Encounter (Signed)
Patient started Crestor 10 mg once weekly back in 02/2014 and was suppose to see me today in clinic.  I called him since he missed lab and appointment, and he tells me that he not only forgot appointment, but he forgot to take Crestor around six weeks ago and stopped it by mistake.  He wasn't having any trouble with it however.  He agrees to get back on weekly crestor 10 mg, and in 4 weeks will increase to Crestor 10 mg twice weekly.  Will recheck blood work in 8 weeks, and see me a few days later.  Appointments made for 7/10 lab and 06/16/14 with me.

## 2014-05-14 ENCOUNTER — Ambulatory Visit: Payer: BC Managed Care – PPO | Admitting: Gastroenterology

## 2014-06-04 ENCOUNTER — Other Ambulatory Visit (INDEPENDENT_AMBULATORY_CARE_PROVIDER_SITE_OTHER): Payer: BC Managed Care – PPO

## 2014-06-04 DIAGNOSIS — E785 Hyperlipidemia, unspecified: Secondary | ICD-10-CM

## 2014-06-04 LAB — LIPID PANEL
Cholesterol: 150 mg/dL (ref 0–200)
HDL: 49.7 mg/dL (ref >39.00)
LDL Cholesterol: 84 mg/dL (ref 0–99)
NonHDL: 100.3
Total CHOL/HDL Ratio: 3
Triglycerides: 83 mg/dL (ref 0.0–149.0)
VLDL: 16.6 mg/dL (ref 0.0–40.0)

## 2014-06-04 LAB — HEPATIC FUNCTION PANEL
ALBUMIN: 3.9 g/dL (ref 3.5–5.2)
ALK PHOS: 44 U/L (ref 39–117)
ALT: 31 U/L (ref 0–53)
AST: 30 U/L (ref 0–37)
Bilirubin, Direct: 0.2 mg/dL (ref 0.0–0.3)
Total Bilirubin: 0.9 mg/dL (ref 0.2–1.2)
Total Protein: 7.2 g/dL (ref 6.0–8.3)

## 2014-06-12 ENCOUNTER — Other Ambulatory Visit: Payer: BC Managed Care – PPO

## 2014-06-16 ENCOUNTER — Ambulatory Visit (INDEPENDENT_AMBULATORY_CARE_PROVIDER_SITE_OTHER): Payer: BC Managed Care – PPO | Admitting: Pharmacist

## 2014-06-16 VITALS — Wt 262.0 lb

## 2014-06-16 DIAGNOSIS — E785 Hyperlipidemia, unspecified: Secondary | ICD-10-CM

## 2014-06-16 DIAGNOSIS — Z79899 Other long term (current) drug therapy: Secondary | ICD-10-CM

## 2014-06-16 MED ORDER — ROSUVASTATIN CALCIUM 10 MG PO TABS: ORAL_TABLET | ORAL | Status: DC

## 2014-06-16 NOTE — Assessment & Plan Note (Addendum)
LDL improving on Crestor, and tolerating it well, however would like to get LDL to < 70 mg/dL at least if possible.  Will titrate Crestor to 10 mg three times per week today and recheck lipid / liver in 3 months, and see me the following day.  He is to continue low fat diet and exercise.  If muscle / joint aches occur after increasing Crestor to tiw, he is to reduce back to biw. If no improvement after reducing dose, he will call me and let me know. Plan: 1.  Increase Crestor to 10 mg to three times per week. 2.  Recheck cholesterol and liver on 09/14/14 (fasting labs) and see Jarae Panas the next day on 09/15/14 at 11:30 am

## 2014-06-16 NOTE — Progress Notes (Signed)
Patient is a pleasant 63 y.o. WM referred to Lipid Clinic by Dr. Burt Knack given h/o statin failure in the setting of CAD.  He is here for f/u today, and is tolerating Crestor 10 mg twice weekly well.  His LDL has dropped from 107 mg/dL down to 84 mg/dL on Crestor.  Paitent isn't positive which statins he has used, but he is pretty sure he took/failed both atorvastatin and simvastatin due to joint pain.  He can't remember if he took pravastatin.,  Zetia was too expensive for him to try.  Patient sees Dr. Trudie Reed for chronic arthritis (? RA).  He does tell me his legs periodically get weak, and he has to grab a wall or sit down so he doesn't fall.  He is working out at [employer] trying to strengthen them.  This pain hasn't worsened since adding Crestor. He has had two hip replacements a few years ago, and he is in significantly less pain since having these procedures done.  All of patient's prior statin use was prior to having his hip replacement procedures.  In 2007 patient was on vacation in [place], and had MI requiring PCI x 1.  No further heart issues since then according to patient.  Patient was found to have Hepatitis A at age 16.  [place] and likely [place].  In 2012, his hepatitis screen was negative for Hep A, B, and C.  He had a severe hepatic reaction with elevated LFTs on Setpra DS in the past.  He currently tells me he has a rash all over his body that comes and goes for the past few years.  He is curious if it could be due to sulfa again.  He is on HCTZ and ferrous sulfate currently.  LFTs are normal currently.  RF:  CAD, HTN, CVA (following ortho surgery), age - LDL goal < 70, non-HDL < 100 at least Meds:  Fish oil 2 g/d, Co-Q 10 600 mg daily, Crestor 10 mg twice weekly. Intolerant:  Failed multiple statins due to joint pain.  He thinks atoravastatin and simvastatin were the only two.  He states he will bring me a list of previously failed medications, in the near  future.  Family history:  Not significant for heart disease Social history:  Drinks 1 glass of wine per night.  Non-smoker. Diet:  Eats greek yogurt, oatmeal, or eggs/toast in the mornings.  Typically eats 4 eggs per week.  Lunch is typically largest meal of the day and is cooked by his wife.  Sandwich and beans or vegetable is typical for lunch.  He eats out a few days of the week for lunch, but typically eats a salad or healthy bagel if out.  Dinner is the smaller of his meals and is often a salad.  Drinks lots of water.  Avoids soda.  Drinks 1-2 glasses of tea he brews at home daily.  Rarely eats desserts.  He does eat assorted nuts sometimes for a snack or after dinner.   Exercise:  Goes to gym 3 days a week and uses recumbent bike.  He can't do too much exercise given his back.  He has considered back surgery, but not ready to move forward with this yet.  Labs: 06/2014:  TC 150, TG 83, LDL 84, HDL 50, (Crestor 10 mg biw) 01/2013 TC 178, TG 154, LDL 107, HDL 40 (not on lipid lowering therapy). 06/2012 - LFTs normal 07/2011 - h/o elevated CRP (Dr.  Hawkes) - 4.5  Current Outpatient Prescriptions  Medication Sig Dispense Refill  . aspirin 325 MG tablet Take 325 mg by mouth 2 (two) times daily.       . betamethasone dipropionate (DIPROLENE) 0.05 % cream Apply topically as needed.        . Cholecalciferol (VITAMIN D3) 5000 UNITS CAPS Take 5,000 Units by mouth daily.      . Coenzyme Q10 300 MG CAPS Take 300 mg by mouth 2 (two) times daily.       . ferrous sulfate 325 (65 FE) MG tablet Take 325 mg by mouth daily with breakfast.      . fish oil-omega-3 fatty acids 1000 MG capsule Take 1 g by mouth daily.       Marland Kitchen latanoprost (XALATAN) 0.005 % ophthalmic solution Place 1 drop into both eyes at bedtime.      . metoprolol succinate (TOPROL-XL) 25 MG 24 hr tablet Take 25 mg by mouth daily.        . OMEGA-3 KRILL OIL PO Take by mouth. TAKE ONE CAPSULE BY MOUTH DAILY      . rosuvastatin (CRESTOR) 10 MG  tablet Take 1 tablet twice weekly  90 tablet  3  . topiramate (TOPAMAX) 50 MG tablet Take 50 mg by mouth daily.      . travoprost, benzalkonium, (TRAVATAN) 0.004 % ophthalmic solution 1 drop at bedtime.      . triamterene-hydrochlorothiazide (MAXZIDE-25) 37.5-25 MG per tablet Take 1 tablet by mouth daily.        . Vitamin D-Vitamin K (VITAMIN K2-VITAMIN D3 PO) TAKE ONE TABLET OF VITAMIN K-2, MENAQUINONE-7 50 MCG      . etodolac (LODINE) 500 MG tablet Take 500 mg by mouth as needed.       No current facility-administered medications for this visit.   Allergies  Allergen Reactions  . Sulfonamide Derivatives   . Tape     Adhesive tape leaves a rash   Family History  Problem Relation Age of Onset  . Hodgkin's lymphoma Father   . Other Paternal Grandmother     Brain tumor

## 2014-06-16 NOTE — Patient Instructions (Signed)
1.  Increase Crestor to 10 mg to three times per week. 2.  Recheck cholesterol and liver on 09/14/14 (fasting labs) and see Jeury Mcnab the next day on 09/15/14 at 11:30 am  If muscle aches occur, reduce back to twice weekly.  If still no improvement, call Ysidro Evert at [phone]

## 2014-06-17 ENCOUNTER — Other Ambulatory Visit: Payer: Self-pay | Admitting: Internal Medicine

## 2014-06-17 DIAGNOSIS — N6310 Unspecified lump in the right breast, unspecified quadrant: Secondary | ICD-10-CM

## 2014-06-23 ENCOUNTER — Ambulatory Visit
Admission: RE | Admit: 2014-06-23 | Discharge: 2014-06-23 | Disposition: A | Discharge status: Home / Self Care | Payer: BC Managed Care – PPO | Source: Ambulatory Visit | Attending: Internal Medicine | Admitting: Internal Medicine

## 2014-06-23 ENCOUNTER — Other Ambulatory Visit: Payer: Self-pay | Admitting: Internal Medicine

## 2014-06-23 DIAGNOSIS — N6310 Unspecified lump in the right breast, unspecified quadrant: Secondary | ICD-10-CM

## 2014-07-24 ENCOUNTER — Other Ambulatory Visit: Payer: Self-pay | Admitting: Physician Assistant

## 2014-07-24 DIAGNOSIS — K118 Other diseases of salivary glands: Secondary | ICD-10-CM

## 2014-07-29 ENCOUNTER — Ambulatory Visit
Admission: RE | Admit: 2014-07-29 | Discharge: 2014-07-29 | Disposition: A | Discharge status: Home / Self Care | Payer: BC Managed Care – PPO | Source: Ambulatory Visit | Attending: Physician Assistant | Admitting: Physician Assistant

## 2014-07-29 DIAGNOSIS — K118 Other diseases of salivary glands: Secondary | ICD-10-CM

## 2014-07-29 MED ORDER — IOHEXOL 300 MG/ML  SOLN
100.0000 mL | Freq: Once | INTRAMUSCULAR | Status: AC | PRN
  Administered 2014-07-29: 100 mL via INTRAVENOUS

## 2014-08-25 ENCOUNTER — Other Ambulatory Visit (HOSPITAL_COMMUNITY): Payer: Self-pay | Admitting: Otolaryngology

## 2014-08-25 NOTE — Addendum Note (Signed)
Addended by: Courtni Balash D on: 08/25/2014 02:40 PM   Modules accepted: Orders  

## 2014-08-27 ENCOUNTER — Encounter (HOSPITAL_COMMUNITY): Payer: Self-pay

## 2014-08-27 ENCOUNTER — Ambulatory Visit (HOSPITAL_COMMUNITY)
Admission: RE | Admit: 2014-08-27 | Discharge: 2014-08-27 | Disposition: A | Discharge status: Home / Self Care | Payer: BC Managed Care – PPO | Source: Ambulatory Visit | Attending: Otolaryngology | Admitting: Otolaryngology

## 2014-08-27 ENCOUNTER — Encounter (HOSPITAL_COMMUNITY): Payer: Self-pay | Admitting: Pharmacy Technician

## 2014-08-27 ENCOUNTER — Encounter (HOSPITAL_COMMUNITY)
Admission: RE | Admit: 2014-08-27 | Discharge: 2014-08-27 | Disposition: A | Discharge status: Home / Self Care | Payer: BC Managed Care – PPO | Source: Ambulatory Visit | Attending: Otolaryngology | Admitting: Otolaryngology

## 2014-08-27 DIAGNOSIS — I10 Essential (primary) hypertension: Secondary | ICD-10-CM | POA: Insufficient documentation

## 2014-08-27 DIAGNOSIS — Z01818 Encounter for other preprocedural examination: Secondary | ICD-10-CM | POA: Diagnosis not present

## 2014-08-27 HISTORY — DX: Unspecified osteoarthritis, unspecified site: M19.90

## 2014-08-27 HISTORY — DX: Cerebral infarction, unspecified: I63.9

## 2014-08-27 HISTORY — DX: Malignant (primary) neoplasm, unspecified: C80.1

## 2014-08-27 LAB — COMPREHENSIVE METABOLIC PANEL
ALT: 32 U/L (ref 0–53)
ANION GAP: 13 (ref 5–15)
AST: 30 U/L (ref 0–37)
Albumin: 3.9 g/dL (ref 3.5–5.2)
Alkaline Phosphatase: 46 U/L (ref 39–117)
BUN: 20 mg/dL (ref 6–23)
CO2: 24 mEq/L (ref 19–32)
Calcium: 9.3 mg/dL (ref 8.4–10.5)
Chloride: 101 mEq/L (ref 96–112)
Creatinine, Ser: 1.14 mg/dL (ref 0.50–1.35)
GFR calc non Af Amer: 66 mL/min — ABNORMAL LOW (ref >90)
GFR, EST AFRICAN AMERICAN: 77 mL/min — AB (ref >90)
GLUCOSE: 102 mg/dL — AB (ref 70–99)
POTASSIUM: 3.8 meq/L (ref 3.7–5.3)
Sodium: 138 mEq/L (ref 137–147)
Total Bilirubin: 0.8 mg/dL (ref 0.3–1.2)
Total Protein: 7.1 g/dL (ref 6.0–8.3)

## 2014-08-27 LAB — CBC
HEMATOCRIT: 45.8 % (ref 39.0–52.0)
HEMOGLOBIN: 16.3 g/dL (ref 13.0–17.0)
MCH: 31.3 pg (ref 26.0–34.0)
MCHC: 35.6 g/dL (ref 30.0–36.0)
MCV: 87.9 fL (ref 78.0–100.0)
Platelets: 162 10*3/uL (ref 150–400)
RBC: 5.21 MIL/uL (ref 4.22–5.81)
RDW: 12.6 % (ref 11.5–15.5)
WBC: 6.2 10*3/uL (ref 4.0–10.5)

## 2014-08-27 NOTE — Progress Notes (Signed)
SPOKE WITH ANGELA (ANESTHESIA) RE; CARDIAC HISTORY. ANGELA REVIEWED CHART AND STATED SHE THOOUGHT WE HAVE EVERYTHING WE NEED.

## 2014-08-27 NOTE — Pre-Procedure Instructions (Signed)
[place]  08/27/2014   Your procedure is scheduled on: Friday, September 04, 2014  Report to [facility] Admitting at 7:30 AM.  Call this number if you have problems the morning of surgery: [phone]   Remember:   Do not eat food or drink liquids after midnight Thursday, September 03, 2014   Take these medicines the morning of surgery with A SIP OF WATER: metoprolol succinate (TOPROL-XL) Stop taking Aspirin,  vitamins, and herbal medications ( Coenzyme Q10, fish oil-omega-3 fatty acids,OMEGA-3 KRILL OIL).Do not take any NSAIDs ie: Ibuprofen, Advil, Naproxen or any medication containing Aspirin; stop Friday, Sept. 25, 2015   Do not wear jewelry, make-up or nail polish.  Do not wear lotions, powders, or perfumes. You may not wear deodorant.  Do not shave 48 hours prior to surgery. Men may shave face and neck.  Do not bring valuables to the hospital.  [facility] is not responsible for any belongings or valuables.               Contacts, dentures or bridgework may not be worn into surgery.  Leave suitcase in the car. After surgery it may be brought to your room.  For patients admitted to the hospital, discharge time is determined by your treatment team.               Patients discharged the day of surgery will not be allowed to drive home.  Name and phone number of your driver:   Special Instructions: Shower using CHG the night before surgery and the morning of surgery.   Please read over the following fact sheets that you were given: Pain Booklet, Coughing and Deep Breathing and Surgical Site Infection Prevention

## 2014-09-04 ENCOUNTER — Encounter (HOSPITAL_COMMUNITY): Payer: BC Managed Care – PPO | Admitting: Emergency Medicine

## 2014-09-04 ENCOUNTER — Ambulatory Visit (HOSPITAL_COMMUNITY): Payer: BC Managed Care – PPO | Admitting: Anesthesiology

## 2014-09-04 ENCOUNTER — Encounter (HOSPITAL_COMMUNITY)
Admission: RE | Disposition: A | Discharge status: Home / Self Care | Payer: Self-pay | Source: Ambulatory Visit | Attending: Otolaryngology

## 2014-09-04 ENCOUNTER — Observation Stay (HOSPITAL_COMMUNITY)
Admission: RE | Admit: 2014-09-04 | Discharge: 2014-09-05 | Disposition: A | Discharge status: Home / Self Care | Payer: BC Managed Care – PPO | Source: Ambulatory Visit | Attending: Otolaryngology | Admitting: Otolaryngology

## 2014-09-04 DIAGNOSIS — I2510 Atherosclerotic heart disease of native coronary artery without angina pectoris: Secondary | ICD-10-CM | POA: Insufficient documentation

## 2014-09-04 DIAGNOSIS — K759 Inflammatory liver disease, unspecified: Secondary | ICD-10-CM | POA: Insufficient documentation

## 2014-09-04 DIAGNOSIS — Z79899 Other long term (current) drug therapy: Secondary | ICD-10-CM | POA: Diagnosis not present

## 2014-09-04 DIAGNOSIS — Z85828 Personal history of other malignant neoplasm of skin: Secondary | ICD-10-CM | POA: Insufficient documentation

## 2014-09-04 DIAGNOSIS — K116 Mucocele of salivary gland: Secondary | ICD-10-CM | POA: Diagnosis not present

## 2014-09-04 DIAGNOSIS — E785 Hyperlipidemia, unspecified: Secondary | ICD-10-CM | POA: Diagnosis not present

## 2014-09-04 DIAGNOSIS — I10 Essential (primary) hypertension: Secondary | ICD-10-CM | POA: Insufficient documentation

## 2014-09-04 DIAGNOSIS — Z8673 Personal history of transient ischemic attack (TIA), and cerebral infarction without residual deficits: Secondary | ICD-10-CM | POA: Diagnosis not present

## 2014-09-04 DIAGNOSIS — M1990 Unspecified osteoarthritis, unspecified site: Secondary | ICD-10-CM | POA: Diagnosis not present

## 2014-09-04 DIAGNOSIS — Z7982 Long term (current) use of aspirin: Secondary | ICD-10-CM | POA: Diagnosis not present

## 2014-09-04 DIAGNOSIS — G4733 Obstructive sleep apnea (adult) (pediatric): Secondary | ICD-10-CM | POA: Insufficient documentation

## 2014-09-04 DIAGNOSIS — I252 Old myocardial infarction: Secondary | ICD-10-CM | POA: Diagnosis not present

## 2014-09-04 DIAGNOSIS — D490 Neoplasm of unspecified behavior of digestive system: Secondary | ICD-10-CM | POA: Diagnosis present

## 2014-09-04 DIAGNOSIS — Z9989 Dependence on other enabling machines and devices: Secondary | ICD-10-CM | POA: Diagnosis not present

## 2014-09-04 DIAGNOSIS — R599 Enlarged lymph nodes, unspecified: Secondary | ICD-10-CM | POA: Diagnosis present

## 2014-09-04 HISTORY — PX: PAROTIDECTOMY: SHX2163

## 2014-09-04 SURGERY — EXCISION, PAROTID GLAND
Anesthesia: General | Site: Neck | Laterality: Left

## 2014-09-04 MED ORDER — HYDROMORPHONE HCL 1 MG/ML IJ SOLN: 0.2500 mg | INTRAMUSCULAR | Status: DC | PRN

## 2014-09-04 MED ORDER — HYDROCODONE-ACETAMINOPHEN 5-325 MG PO TABS
1.0000 | ORAL_TABLET | ORAL | Status: DC | PRN
  Administered 2014-09-05: 1 via ORAL
  Filled 2014-09-04: qty 1

## 2014-09-04 MED ORDER — ACETAMINOPHEN 80 MG PO CHEW
320.0000 mg | CHEWABLE_TABLET | ORAL | Status: DC | PRN
  Filled 2014-09-04: qty 4

## 2014-09-04 MED ORDER — DEXAMETHASONE SODIUM PHOSPHATE 4 MG/ML IJ SOLN
INTRAMUSCULAR | Status: DC | PRN
  Administered 2014-09-04: 4 mg via INTRAVENOUS

## 2014-09-04 MED ORDER — CEFAZOLIN SODIUM-DEXTROSE 2-3 GM-% IV SOLR
INTRAVENOUS | Status: DC | PRN
  Administered 2014-09-04: 2 g via INTRAVENOUS

## 2014-09-04 MED ORDER — VITAMIN D3 25 MCG (1000 UNIT) PO TABS
5000.0000 [IU] | ORAL_TABLET | Freq: Every day | ORAL | Status: DC
  Administered 2014-09-04: 5000 [IU] via ORAL
  Filled 2014-09-04 (×2): qty 5

## 2014-09-04 MED ORDER — BACITRACIN ZINC 500 UNIT/GM EX OINT
TOPICAL_OINTMENT | CUTANEOUS | Status: DC | PRN
  Administered 2014-09-04: 1 via TOPICAL

## 2014-09-04 MED ORDER — FENTANYL CITRATE 0.05 MG/ML IJ SOLN
INTRAMUSCULAR | Status: AC
  Filled 2014-09-04: qty 5

## 2014-09-04 MED ORDER — LIDOCAINE-EPINEPHRINE 1 %-1:100000 IJ SOLN
INTRAMUSCULAR | Status: AC
  Filled 2014-09-04: qty 1

## 2014-09-04 MED ORDER — KCL IN DEXTROSE-NACL 20-5-0.45 MEQ/L-%-% IV SOLN
INTRAVENOUS | Status: DC
  Filled 2014-09-04: qty 1000

## 2014-09-04 MED ORDER — MORPHINE SULFATE 2 MG/ML IJ SOLN
1.0000 mg | INTRAMUSCULAR | Status: DC | PRN
  Administered 2014-09-04: 1 mg via INTRAVENOUS
  Filled 2014-09-04: qty 1

## 2014-09-04 MED ORDER — BACITRACIN ZINC 500 UNIT/GM EX OINT
1.0000 "application " | TOPICAL_OINTMENT | Freq: Three times a day (TID) | CUTANEOUS | Status: DC
  Administered 2014-09-04 – 2014-09-05 (×3): 1 via TOPICAL
  Filled 2014-09-04: qty 28.35

## 2014-09-04 MED ORDER — PHENOL 1.4 % MT LIQD: 2.0000 | Freq: Four times a day (QID) | OROMUCOSAL | Status: DC | PRN

## 2014-09-04 MED ORDER — ONDANSETRON HCL 4 MG/2ML IJ SOLN: 4.0000 mg | Freq: Four times a day (QID) | INTRAMUSCULAR | Status: DC | PRN

## 2014-09-04 MED ORDER — KCL IN DEXTROSE-NACL 20-5-0.45 MEQ/L-%-% IV SOLN
INTRAVENOUS | Status: DC
  Administered 2014-09-04 – 2014-09-05 (×3): via INTRAVENOUS
  Filled 2014-09-04 (×3): qty 1000

## 2014-09-04 MED ORDER — ATORVASTATIN CALCIUM 20 MG PO TABS
20.0000 mg | ORAL_TABLET | Freq: Every day | ORAL | Status: DC
  Administered 2014-09-04: 20 mg via ORAL
  Filled 2014-09-04 (×2): qty 1

## 2014-09-04 MED ORDER — BACITRACIN ZINC 500 UNIT/GM EX OINT
TOPICAL_OINTMENT | CUTANEOUS | Status: AC
  Filled 2014-09-04: qty 15

## 2014-09-04 MED ORDER — LACTATED RINGERS IV SOLN
INTRAVENOUS | Status: DC
  Administered 2014-09-04 (×2): via INTRAVENOUS

## 2014-09-04 MED ORDER — ONDANSETRON HCL 4 MG/2ML IJ SOLN
INTRAMUSCULAR | Status: DC | PRN
  Administered 2014-09-04: 4 mg via INTRAVENOUS

## 2014-09-04 MED ORDER — CEFAZOLIN SODIUM 1-5 GM-% IV SOLN
1.0000 g | Freq: Three times a day (TID) | INTRAVENOUS | Status: DC
  Administered 2014-09-04 – 2014-09-05 (×2): 1 g via INTRAVENOUS
  Filled 2014-09-04 (×3): qty 50

## 2014-09-04 MED ORDER — METOPROLOL SUCCINATE ER 25 MG PO TB24
25.0000 mg | ORAL_TABLET | Freq: Every day | ORAL | Status: DC
  Filled 2014-09-04: qty 1

## 2014-09-04 MED ORDER — MIDAZOLAM HCL 2 MG/2ML IJ SOLN
INTRAMUSCULAR | Status: AC
  Filled 2014-09-04: qty 2

## 2014-09-04 MED ORDER — SUCCINYLCHOLINE CHLORIDE 20 MG/ML IJ SOLN
INTRAMUSCULAR | Status: DC | PRN
  Administered 2014-09-04: 100 mg via INTRAVENOUS

## 2014-09-04 MED ORDER — DIPHENHYDRAMINE HCL 50 MG/ML IJ SOLN: 12.5000 mg | Freq: Four times a day (QID) | INTRAMUSCULAR | Status: DC | PRN

## 2014-09-04 MED ORDER — HYDRALAZINE HCL 20 MG/ML IJ SOLN
10.0000 mg | INTRAMUSCULAR | Status: DC | PRN
Start: 2014-09-04 — End: 2014-09-05

## 2014-09-04 MED ORDER — LATANOPROST 0.005 % OP SOLN
1.0000 [drp] | Freq: Every day | OPHTHALMIC | Status: DC
  Administered 2014-09-04: 1 [drp] via OPHTHALMIC
  Filled 2014-09-04: qty 2.5

## 2014-09-04 MED ORDER — TRIAMTERENE-HCTZ 37.5-25 MG PO TABS
1.0000 | ORAL_TABLET | Freq: Every day | ORAL | Status: DC
  Administered 2014-09-04: 1 via ORAL
  Filled 2014-09-04 (×2): qty 1

## 2014-09-04 MED ORDER — LIDOCAINE-EPINEPHRINE 1 %-1:100000 IJ SOLN
INTRAMUSCULAR | Status: DC | PRN
  Administered 2014-09-04: 5 mL

## 2014-09-04 MED ORDER — VITAMIN D3 125 MCG (5000 UT) PO CAPS: 5000.0000 [IU] | ORAL_CAPSULE | Freq: Every day | ORAL | Status: DC

## 2014-09-04 MED ORDER — PROPOFOL 10 MG/ML IV BOLUS
INTRAVENOUS | Status: AC
  Filled 2014-09-04: qty 20

## 2014-09-04 MED ORDER — MIDAZOLAM HCL 5 MG/5ML IJ SOLN
INTRAMUSCULAR | Status: DC | PRN
  Administered 2014-09-04: 2 mg via INTRAVENOUS

## 2014-09-04 MED ORDER — FENTANYL CITRATE 0.05 MG/ML IJ SOLN
INTRAMUSCULAR | Status: DC | PRN
Start: 2014-09-04 — End: 2014-09-04
  Administered 2014-09-04: 100 ug via INTRAVENOUS
  Administered 2014-09-04 (×3): 50 ug via INTRAVENOUS

## 2014-09-04 MED ORDER — 0.9 % SODIUM CHLORIDE (POUR BTL) OPTIME
TOPICAL | Status: DC | PRN
  Administered 2014-09-04: 1000 mL

## 2014-09-04 MED ORDER — LIDOCAINE HCL (CARDIAC) 20 MG/ML IV SOLN
INTRAVENOUS | Status: DC | PRN
  Administered 2014-09-04: 80 mg via INTRAVENOUS

## 2014-09-04 MED ORDER — FERROUS SULFATE 325 (65 FE) MG PO TABS
325.0000 mg | ORAL_TABLET | Freq: Every day | ORAL | Status: DC
  Administered 2014-09-05: 325 mg via ORAL
  Filled 2014-09-04 (×2): qty 1

## 2014-09-04 MED ORDER — PROPOFOL 10 MG/ML IV BOLUS
INTRAVENOUS | Status: DC | PRN
  Administered 2014-09-04: 160 mg via INTRAVENOUS

## 2014-09-04 MED ORDER — MORPHINE SULFATE 2 MG/ML IJ SOLN
2.0000 mg | INTRAMUSCULAR | Status: DC | PRN
  Administered 2014-09-04: 2 mg via INTRAVENOUS
  Filled 2014-09-04: qty 1

## 2014-09-04 SURGICAL SUPPLY — 50 items
ATTRACTOMAT 16X20 MAGNETIC DRP (DRAPES) ×1 IMPLANT
BLADE SURG ROTATE 9660 (MISCELLANEOUS) IMPLANT
CANISTER SUCTION 2500CC (MISCELLANEOUS) ×2 IMPLANT
CLEANER TIP ELECTROSURG 2X2 (MISCELLANEOUS) ×2 IMPLANT
CONT SPEC 4OZ CLIKSEAL STRL BL (MISCELLANEOUS) ×2 IMPLANT
CORDS BIPOLAR (ELECTRODE) ×2 IMPLANT
COVER SURGICAL LIGHT HANDLE (MISCELLANEOUS) ×2 IMPLANT
CRADLE DONUT ADULT HEAD (MISCELLANEOUS) IMPLANT
DRAIN JACKSON RD 7FR 3/32 (WOUND CARE) ×1 IMPLANT
DRAPE INCISE 13X13 STRL (DRAPES) ×2 IMPLANT
DRAPE PROXIMA HALF (DRAPES) ×2 IMPLANT
ELECT COATED BLADE 2.86 ST (ELECTRODE) ×2 IMPLANT
ELECT PAIRED SUBDERMAL (MISCELLANEOUS) ×2
ELECT REM PT RETURN 9FT ADLT (ELECTROSURGICAL) ×2
ELECTRODE PAIRED SUBDERMAL (MISCELLANEOUS) IMPLANT
ELECTRODE REM PT RTRN 9FT ADLT (ELECTROSURGICAL) ×1 IMPLANT
EVACUATOR SILICONE 100CC (DRAIN) ×2 IMPLANT
FORCEPS TISS BAYO ENTCEPS (INSTRUMENTS) IMPLANT
GAUZE SPONGE 4X4 16PLY XRAY LF (GAUZE/BANDAGES/DRESSINGS) ×3 IMPLANT
GLOVE BIO SURGEON STRL SZ7.5 (GLOVE) ×2 IMPLANT
GLOVE BIOGEL PI IND STRL 6.5 (GLOVE) IMPLANT
GLOVE BIOGEL PI IND STRL 7.0 (GLOVE) IMPLANT
GLOVE BIOGEL PI INDICATOR 6.5 (GLOVE) ×1
GLOVE BIOGEL PI INDICATOR 7.0 (GLOVE) ×2
GLOVE SURG SS PI 6.0 STRL IVOR (GLOVE) ×1 IMPLANT
GLOVE SURG SS PI 7.0 STRL IVOR (GLOVE) ×1 IMPLANT
GOWN STRL REUS W/ TWL LRG LVL3 (GOWN DISPOSABLE) ×2 IMPLANT
GOWN STRL REUS W/TWL LRG LVL3 (GOWN DISPOSABLE) ×8
KIT BASIN OR (CUSTOM PROCEDURE TRAY) ×2 IMPLANT
KIT ROOM TURNOVER OR (KITS) ×2 IMPLANT
NDL HYPO 25GX1X1/2 BEV (NEEDLE) IMPLANT
NEEDLE HYPO 25GX1X1/2 BEV (NEEDLE) ×2 IMPLANT
NS IRRIG 1000ML POUR BTL (IV SOLUTION) ×3 IMPLANT
PAD ARMBOARD 7.5X6 YLW CONV (MISCELLANEOUS) ×4 IMPLANT
PENCIL BUTTON HOLSTER BLD 10FT (ELECTRODE) ×2 IMPLANT
PROBE NERVBE PRASS .33 (MISCELLANEOUS) ×1 IMPLANT
SPECIMEN JAR MEDIUM (MISCELLANEOUS) ×2 IMPLANT
STAPLER VISISTAT 35W (STAPLE) ×2 IMPLANT
SUT ETHILON 2 0 FS 18 (SUTURE) ×2 IMPLANT
SUT ETHILON 5 0 P 3 18 (SUTURE) ×2
SUT NYLON ETHILON 5-0 P-3 1X18 (SUTURE) ×1 IMPLANT
SUT SILK 2 0 FS (SUTURE) ×2 IMPLANT
SUT SILK 2 0 SH CR/8 (SUTURE) ×1 IMPLANT
SUT SILK 3 0 REEL (SUTURE) ×1 IMPLANT
SUT VIC AB 3-0 SH 27 (SUTURE) ×2
SUT VIC AB 3-0 SH 27X BRD (SUTURE) ×1 IMPLANT
SUT VIC AB 4-0 PS2 27 (SUTURE) IMPLANT
TOWEL OR 17X24 6PK STRL BLUE (TOWEL DISPOSABLE) ×2 IMPLANT
TOWEL OR 17X26 10 PK STRL BLUE (TOWEL DISPOSABLE) ×2 IMPLANT
TRAY ENT MC OR (CUSTOM PROCEDURE TRAY) ×2 IMPLANT

## 2014-09-04 NOTE — Progress Notes (Signed)
Subjective: POD#0 from left superficial parotidectomy. Doing well, facial nerve completely intact  Objective: Vital signs in last 24 hours: Temp:  [97.7 F (36.5 C)-98.6 F (37 C)] 98.2 F (36.8 C) (10/02 1331) Pulse Rate:  [68-90] 78 (10/02 1331) Resp:  [12-20] 12 (10/02 1331) BP: (123-134)/(63-75) 134/67 mmHg (10/02 1331) SpO2:  [94 %-98 %] 94 % (10/02 1331) Weight:  [115.667 kg (255 lb)] 115.667 kg (255 lb) (10/02 0756)  Facial nerve House Brackmann 1/6 bilaterally in all divisions, left neck'face supple with JP holding bulb suction and soft with no hematoma and incision C/D/I  @LABLAST2 (wbc:2,hgb:2,hct:2,plt:2) No results found for this basename: NA, K, CL, CO2, GLUCOSE, BUN, CREATININE, CALCIUM,  in the last 72 hours  Medications:  Scheduled Meds: . atorvastatin  20 mg Oral q1800  . bacitracin  1 application Topical 3 times per day  .  ceFAZolin (ANCEF) IV  1 g Intravenous Q8H  . cholecalciferol  5,000 Units Oral Daily  . [START ON 09/05/2014] ferrous sulfate  325 mg Oral Q breakfast  . latanoprost  1 drop Both Eyes QHS  . [START ON 09/05/2014] metoprolol succinate  25 mg Oral Daily  . triamterene-hydrochlorothiazide  1 tablet Oral Daily   Continuous Infusions: . dextrose 5 % and 0.45 % NaCl with KCl 20 mEq/L 75 mL/hr at 09/04/14 1424   PRN Meds:.acetaminophen, diphenhydrAMINE, hydrALAZINE, HYDROcodone-acetaminophen, morphine injection, ondansetron (ZOFRAN) IV, phenol  Assessment/Plan: Doing well POD#0 from left superficial parotidectomy. Will monitor drain output and can likely go home once drain output sufficiently low.   LOS: 0 days   Ruby Cola 09/04/2014, 4:25 PM

## 2014-09-04 NOTE — Brief Op Note (Signed)
09/04/2014  11:48 AM  PATIENT:  Tyler Bradley  63 y.o. male  PRE-OPERATIVE DIAGNOSIS:  PAROTID MASS  POST-OPERATIVE DIAGNOSIS:  PAROTID MASS  PROCEDURE:  Procedure(s): PAROTIDECTOMY (Left)  SURGEON:  Surgeon(s) and Role:    * Melida Quitter, MD - Primary    * Jerrell Belfast, MD - Assisting  PHYSICIAN ASSISTANT:   ASSISTANTS: Shoemaker   ANESTHESIA:   general  EBL:  Total I/O In: 1000 [I.V.:1000] Out: -   BLOOD ADMINISTERED:none  DRAINS: (7 Fr) Jackson-Pratt drain(s) with closed bulb suction in the left parotid   LOCAL MEDICATIONS USED:  LIDOCAINE   SPECIMEN:  Source of Specimen:  left parotid mass  DISPOSITION OF SPECIMEN:  PATHOLOGY  COUNTS:  YES  TOURNIQUET:  * No tourniquets in log *  DICTATION: .Other Dictation: Dictation Number [id number]  PLAN OF CARE: Admit for overnight observation  PATIENT DISPOSITION:  PACU - hemodynamically stable.   Delay start of Pharmacological VTE agent (>24hrs) due to surgical blood loss or risk of bleeding: yes

## 2014-09-04 NOTE — Anesthesia Preprocedure Evaluation (Signed)
Anesthesia Evaluation  Patient identified by MRN, date of birth, ID band Patient awake    Reviewed: Allergy & Precautions, H&P , NPO status , Patient's Chart, lab work & pertinent test results  Airway Mallampati: II      Dental   Pulmonary sleep apnea ,          Cardiovascular hypertension, + CAD and + Past MI     Neuro/Psych    GI/Hepatic negative GI ROS, (+) Hepatitis -  Endo/Other    Renal/GU negative Renal ROS     Musculoskeletal   Abdominal   Peds  Hematology   Anesthesia Other Findings   Reproductive/Obstetrics                           Anesthesia Physical Anesthesia Plan  ASA: III  Anesthesia Plan: General   Post-op Pain Management:    Induction: Intravenous  Airway Management Planned: Oral ETT  Additional Equipment:   Intra-op Plan:   Post-operative Plan: Extubation in OR  Informed Consent: I have reviewed the patients History and Physical, chart, labs and discussed the procedure including the risks, benefits and alternatives for the proposed anesthesia with the patient or authorized representative who has indicated his/her understanding and acceptance.   Dental advisory given  Plan Discussed with: CRNA and Anesthesiologist  Anesthesia Plan Comments:         Anesthesia Quick Evaluation

## 2014-09-04 NOTE — Anesthesia Postprocedure Evaluation (Signed)
  Anesthesia Post-op Note  Patient: Tyler Bradley  Procedure(s) Performed: Procedure(s): PAROTIDECTOMY (Left)  Patient Location: PACU  Anesthesia Type:General  Level of Consciousness: awake  Airway and Oxygen Therapy: Patient Spontanous Breathing  Post-op Pain: mild  Post-op Assessment: Post-op Vital signs reviewed  Post-op Vital Signs: Reviewed  Last Vitals:  Filed Vitals:   09/04/14 1331  BP: 134/67  Pulse: 78  Temp: 36.8 C  Resp: 12    Complications: No apparent anesthesia complications

## 2014-09-04 NOTE — H&P (Signed)
Tyler Bradley is an 64 y.o. male.   Chief Complaint: left parotid mass HPI: 63 year old with soft mass in left parotid for the past two months after previous history of recurring swelling of the gland that would resolve with palpation.  Presents for excision.  Past Medical History  Diagnosis Date  . CAD (coronary artery disease)   . HTN (hypertension)   . Hyperlipemia   . Fatigue   . Hepatitis   . History of rheumatic fever   . Heart attack     2007 DR. Burt Knack   . Stroke     04/2012  . OSA (obstructive sleep apnea)     CPAP   . Arthritis     OA   . Cancer     BASAL CELL CARCINOMA    Past Surgical History  Procedure Laterality Date  . Basal cell carcinoma excision      from forehead  . Umbilical hernia repair  1998  . Septoplasty  1984  . Total hip arthroplasty Right 11/2011  . Total hip arthroplasty Left 04/2012  . Coronary angioplasty with stent placement  2007    Family History  Problem Relation Age of Onset  . Hodgkin's lymphoma Father   . Other Paternal Grandmother     Brain tumor   Social History:  reports that he has never smoked. He does not have any smokeless tobacco history on file. He reports that he drinks alcohol. He reports that he does not use illicit drugs.  Allergies:  Allergies  Allergen Reactions  . Sulfonamide Derivatives Other (See Comments)    Drug induced hepatitis.   . Tape     Adhesive tape leaves a rash    Medications Prior to Admission  Medication Sig Dispense Refill  . aspirin 325 MG tablet Take 325 mg by mouth 2 (two) times daily.       . Cholecalciferol (VITAMIN D3) 5000 UNITS CAPS Take 5,000 Units by mouth daily.      . Coenzyme Q10 300 MG CAPS Take 300 mg by mouth 2 (two) times daily.       . ferrous sulfate 325 (65 FE) MG tablet Take 325 mg by mouth daily with breakfast.      . fish oil-omega-3 fatty acids 1000 MG capsule Take 1 g by mouth daily.       Marland Kitchen latanoprost (XALATAN) 0.005 % ophthalmic solution Place 1 drop into  both eyes at bedtime.      . metoprolol succinate (TOPROL-XL) 25 MG 24 hr tablet Take 25 mg by mouth daily.        . Multiple Vitamins-Minerals (ICAPS PO) Take 1 tablet by mouth daily.      . OMEGA-3 KRILL OIL PO Take 1 capsule by mouth daily.       . rosuvastatin (CRESTOR) 10 MG tablet Take 1 tablet three times per week  30 tablet  3  . triamterene-hydrochlorothiazide (MAXZIDE-25) 37.5-25 MG per tablet Take 1 tablet by mouth daily.          No results found for this or any previous visit (from the past 48 hour(s)). No results found.  Review of Systems  All other systems reviewed and are negative.   Blood pressure 130/63, pulse 68, temperature 97.7 F (36.5 C), temperature source Oral, resp. rate 20, height 6\' 2"  (1.88 m), weight 115.667 kg (255 lb), SpO2 96.00%. Physical Exam  Constitutional: He is oriented to person, place, and time. He appears well-developed and well-nourished. No distress.  HENT:  Head: Normocephalic and atraumatic.  Right Ear: External ear normal.  Left Ear: External ear normal.  Nose: Nose normal.  Mouth/Throat: Oropharynx is clear and moist.  Eyes: Conjunctivae and EOM are normal. Pupils are equal, round, and reactive to light.  Neck: Normal range of motion. Neck supple.  Left superior parotid 3 x 3 soft mass.  Cardiovascular: Normal rate.   Respiratory: Effort normal.  Musculoskeletal: Normal range of motion.  Neurological: He is alert and oriented to person, place, and time. No cranial nerve deficit.  Skin: Skin is warm and dry.  Psychiatric: He has a normal mood and affect. His behavior is normal. Judgment and thought content normal.     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  Observe overnight after surgery with drain in place and due to sleep apnea.  Tyler Bradley 09/04/2014, 9:18 AM

## 2014-09-04 NOTE — Transfer of Care (Signed)
Immediate Anesthesia Transfer of Care Note  Patient: Tyler Bradley  Procedure(s) Performed: Procedure(s): PAROTIDECTOMY (Left)  Patient Location: PACU  Anesthesia Type:General  Level of Consciousness: awake, alert , oriented, patient cooperative and responds to stimulation  Airway & Oxygen Therapy: Patient Spontanous Breathing and Patient connected to face mask oxygen  Post-op Assessment: Report given to PACU RN, Post -op Vital signs reviewed and stable and Patient moving all extremities X 4  Post vital signs: Reviewed and stable  Complications: No apparent anesthesia complications

## 2014-09-05 DIAGNOSIS — K116 Mucocele of salivary gland: Secondary | ICD-10-CM | POA: Diagnosis not present

## 2014-09-05 NOTE — Progress Notes (Signed)
Discharge instructions/prescriptions given and explained to pt.  Pt verbalized understanding of all orders/instructions.  IV removed by NT.  Pt has no complaints of pain at this time and is in no distress.  VSS.  Awaiting ride home from wife. Syliva Overman

## 2014-09-05 NOTE — Discharge Instructions (Signed)
Regular diet, up ad lib, follow up with Dr. Redmond Baseman in one week. Clean incision gently with soap and water and apply Neosporin TID. Prescriptions for Keflex, hydrocodone, and Zofran are on chart.

## 2014-09-05 NOTE — Discharge Summary (Signed)
09/05/2014  8:36 AM  Date of Admission: 09/04/2014 Date of Discharge: 09/05/2014  Discharge MD: Ruby Cola, MD  Admitting MD: Melida Quitter, MD  Reason for admission/final discharge diagnosis: left parotid mass, superficial parotidectomy  Labs: see EPIC  Procedure(s) performed: left superficial parotidectomy  Discharge Condition: good  Discharge Exam: facial nerve House Brackmann 1/6 bilaterally in all divisions, neck supple, drain removed POD#1  Discharge Instructions: No heavy lifting, follow up with  Dr. Redmond Baseman At [facility] in 1 week. Apply OTC neosporin to left neck incision TID  Hospital Course: did well after left superficial parotidectomy and drain removed/discharged POD#1.  Ruby Cola 8:36 AM 09/05/2014

## 2014-09-05 NOTE — Op Note (Signed)
NAME:  Tyler Bradley, Tyler Bradley NO.:  [id number]  MEDICAL RECORD NO.:  [id number]  LOCATION:  [id number]                        FACILITY:  [facility]  PHYSICIAN:  Onnie Graham, MD     DATE OF BIRTH:  1951-07-18  DATE OF PROCEDURE:  09/04/2014 DATE OF DISCHARGE:                              OPERATIVE REPORT   PREOPERATIVE DIAGNOSIS:  Left parotid gland neoplasm.  POSTOPERATIVE DIAGNOSIS:  Left parotid gland neoplasm.  PROCEDURE:  Left superficial parotidectomy with dissection of facial nerve.  SURGEONS:  Onnie Graham, MD.  ASSISTANT:  Early Chars. Wilburn Cornelia, M.D.  ANESTHESIA:  General endotracheal anesthesia.  COMPLICATIONS:  None.  INDICATION:  The patient is a 63 year old male who has a long history of recurring swelling of the left parotid gland that historically responded well to massage.  Then, about 2 months ago, he developed a persistent swelling that seemed to represent a cystic mass.  He presents for surgical removal.  FINDINGS:  Indeed, a cystic mass was found in the superficial parotid gland superiorly.  This estimated measurement of about 3 cm.  DESCRIPTION OF PROCEDURE:  The patient was identified in the holding room and informed consent having been obtained with discussion of risks, benefits, alternatives, the patient was brought to the operative suite and put on the operative table in supine position.  Anesthesia was induced.  The patient was intubated by the Anesthesia team without difficulty.  The patient was given intravenous antibiotics during the case.  The eyes were taped closed and the nerve integrity monitor was placed in a standard fashion to monitor the left facial nerve.  This was turned on during the case.  The incision was marked with a marking pen and injected with 1% lidocaine with 1:100,000 epinephrine.  The left face was prepped and draped in sterile fashion.  The preauricular incision was made with a 15 blade scalpel and extended down  onto the neck a bit.  Subcutaneous tissues were then divided using Bovie electrocautery and a preparotid flap was then elevated anteriorly to the periphery of the gland.  This was done with Bovie electrocautery.  The left earlobe was then retracted with a stay suture.  The anterior flap was also retracted in the same fashion.  Dissection was then performed down along the tragal cartilage to the stylomastoid foramen region. Inferior gland tissue was divided from the sternocleidomastoid muscle to improve exposure.  Careful dissection in this area ultimately yielded the main trunk of the facial nerve which was then carefully dissected in an antegrade fashion to the main division.  Short segment of the inferior division was traced, starting to divide the parotid gland over the nerve.  The majority of dissection was then performed following the superior division, where dissection was first performed inferior to the cystic mass dividing the parotid gland and cauterizing out to the peripheral extent of that branch.  The most superior branch was then dissected dividing parotid gland tissue staying posterior to the tumor. The dissection then continued tracing each branch of the superior division, moving from posterior to anterior end of the gland, and then also moving from inferior to superior end of the gland.  This continued until the gland was able to  be dissected free of all surrounding tissues keeping each of the nerve branches safe from dissection.  The tumor was then removed and passed to nursing for pathology.  The nerve was able to be stimulated at 0.5 milliamps in all branches that were exposed.  The wound was copiously irrigated with saline.  A little bit of bleeding was controlled with bipolar electrocautery.  A 7-French round drain was placed in the depths of the wound and secured at the inferior extent of the incision using a 2-0 nylon suture in a standard drain stitch.   The subcutaneous layer of the incision was closed with 3-0 Vicryl suture in a simple interrupted fashion.  The drain was hooked to suction during closure.  The skin was closed with 5-0 nylon in a simple running fashion.  At this point, drapes were removed and the patient was cleaned off.  The drain was changed to bulb suction and secured to the left shoulder using tape.  Bacitracin ointment was added to the incision.  He was then awoken and extubated and moved to recovery room in stable condition.     Onnie Graham, MD     DDB/MEDQ  D:  09/04/2014  T:  09/04/2014  Job:  [id number]

## 2014-09-07 ENCOUNTER — Encounter (HOSPITAL_COMMUNITY): Payer: Self-pay | Admitting: Otolaryngology

## 2014-09-14 ENCOUNTER — Other Ambulatory Visit (INDEPENDENT_AMBULATORY_CARE_PROVIDER_SITE_OTHER): Payer: BC Managed Care – PPO | Admitting: *Deleted

## 2014-09-14 DIAGNOSIS — E785 Hyperlipidemia, unspecified: Secondary | ICD-10-CM

## 2014-09-14 DIAGNOSIS — Z79899 Other long term (current) drug therapy: Secondary | ICD-10-CM

## 2014-09-14 LAB — LIPID PANEL
CHOL/HDL RATIO: 3
Cholesterol: 124 mg/dL (ref 0–200)
HDL: 38.5 mg/dL — ABNORMAL LOW (ref >39.00)
LDL Cholesterol: 66 mg/dL (ref 0–99)
NONHDL: 85.5
TRIGLYCERIDES: 99 mg/dL (ref 0.0–149.0)
VLDL: 19.8 mg/dL (ref 0.0–40.0)

## 2014-09-14 LAB — HEPATIC FUNCTION PANEL
ALT: 26 U/L (ref 0–53)
AST: 26 U/L (ref 0–37)
Albumin: 3.4 g/dL — ABNORMAL LOW (ref 3.5–5.2)
Alkaline Phosphatase: 44 U/L (ref 39–117)
BILIRUBIN TOTAL: 1 mg/dL (ref 0.2–1.2)
Bilirubin, Direct: 0.2 mg/dL (ref 0.0–0.3)
Total Protein: 7.2 g/dL (ref 6.0–8.3)

## 2014-09-15 ENCOUNTER — Ambulatory Visit (INDEPENDENT_AMBULATORY_CARE_PROVIDER_SITE_OTHER): Payer: BC Managed Care – PPO | Admitting: Pharmacist

## 2014-09-15 DIAGNOSIS — E785 Hyperlipidemia, unspecified: Secondary | ICD-10-CM

## 2014-09-15 MED ORDER — ROSUVASTATIN CALCIUM 10 MG PO TABS: ORAL_TABLET | ORAL | Status: DC

## 2014-09-15 NOTE — Patient Instructions (Signed)
Continue Crestor 10mg  three days of the week.   Recheck labs in 6 months.

## 2014-09-15 NOTE — Progress Notes (Signed)
Patient is a pleasant 63 y.o. WM referred to Lipid Clinic by Dr. Burt Knack given h/o statin failure in the setting of CAD.  He is here for f/u today, and is tolerating Crestor 10 mg three times weekly well.  His LDL has dropped from 107 mg/dL to 84 mg/dL to 66 mg/dL on Crestor.    PMH:  In 2007 patient was on vacation in [place], and had MI requiring PCI x 1.  No further heart issues since then according to patient.  Patient was found to have Hepatitis A at age 54.  [place].  In 2012, his hepatitis screen was negative for Hep A, B, and C.  He had a severe hepatic reaction with elevated LFTs on Setpra DS in the past.   Intolerances: Patient isn't positive which statins he has used, but he is pretty sure he took/failed both atorvastatin and simvastatin due to joint pain.  He can't remember if he took pravastatin.,  Zetia was too expensive for him to try.    Patient sees Dr. Trudie Reed for chronic arthritis (? RA).  He does tell me his legs periodically get weak, and he has to grab a wall or sit down so he doesn't fall.  He is working out at [employer] trying to strengthen them.  This pain hasn't worsened since adding Crestor. He has had two hip replacements a few years ago, and he is in significantly less pain since having these procedures done.   RF:  CAD, HTN, CVA (following ortho surgery), age - LDL goal < 70, non-HDL < 100 at least Meds:  Fish oil 2 g/d, Co-Q 10 600 mg daily, Crestor 10 mg twice weekly. Intolerant:  Failed multiple statins due to joint pain.  He thinks atoravastatin and simvastatin were the only two.  He states he will bring me a list of previously failed medications, in the near future.  Family history:  Not significant for heart disease Social history:  Drinks 1 glass of wine per night.  Non-smoker.  Labs: 09/2014: TC- 124, TG- 99, LDL- 66, HDL- 39.  LFTs normal (Crestor 10mg  tiw) 06/2014:  TC 150, TG 83, LDL 84, HDL 50, (Crestor 10 mg biw) 01/2013  TC 178, TG 154, LDL 107, HDL 40 (not on lipid lowering therapy). 06/2012 - LFTs normal 07/2011 - h/o elevated CRP (Dr. Trudie Reed) - 4.5  Current Outpatient Prescriptions  Medication Sig Dispense Refill  . aspirin 325 MG tablet Take 325 mg by mouth 2 (two) times daily.       . Cholecalciferol (VITAMIN D3) 5000 UNITS CAPS Take 5,000 Units by mouth daily.      . Coenzyme Q10 300 MG CAPS Take 300 mg by mouth 2 (two) times daily.       . ferrous sulfate 325 (65 FE) MG tablet Take 325 mg by mouth daily with breakfast.      . latanoprost (XALATAN) 0.005 % ophthalmic solution Place 1 drop into both eyes at bedtime.      . metoprolol succinate (TOPROL-XL) 25 MG 24 hr tablet Take 25 mg by mouth daily.        . Multiple Vitamins-Minerals (ICAPS PO) Take 1 tablet by mouth daily.      . Omega-3 Fatty Acids (FISH OIL) 1000 MG CAPS Take 1 capsule by mouth daily.      . OMEGA-3 KRILL OIL PO Take 1 capsule by mouth daily.       . rosuvastatin (  CRESTOR) 10 MG tablet Take 1 tablet three times per week  30 tablet  3  . triamterene-hydrochlorothiazide (MAXZIDE-25) 37.5-25 MG per tablet Take 1 tablet by mouth daily.         No current facility-administered medications for this visit.   Allergies  Allergen Reactions  . Sulfa Antibiotics Itching  . Sulfonamide Derivatives Other (See Comments)    Drug induced hepatitis.   . Tape     Adhesive tape leaves a rash   Family History  Problem Relation Age of Onset  . Hodgkin's lymphoma Father   . Other Paternal Grandmother     Brain tumor

## 2014-09-16 NOTE — Assessment & Plan Note (Signed)
Pt's cholesterol at goal since increasing Crestor from 2 days a week to 3 days a week.  Pt tolerating therapy well.  Will continue current dose and recheck labs in 6 months.  Pt will call if he has any problems in the interim.

## 2015-01-20 ENCOUNTER — Ambulatory Visit: Payer: BC Managed Care – PPO | Admitting: Cardiovascular Disease

## 2015-03-03 ENCOUNTER — Other Ambulatory Visit: Payer: Self-pay | Admitting: Cardiovascular Disease

## 2015-03-16 ENCOUNTER — Other Ambulatory Visit (INDEPENDENT_AMBULATORY_CARE_PROVIDER_SITE_OTHER): Payer: Medicare Other

## 2015-03-16 DIAGNOSIS — E785 Hyperlipidemia, unspecified: Secondary | ICD-10-CM

## 2015-03-16 LAB — HEPATIC FUNCTION PANEL
ALBUMIN: 3.8 g/dL (ref 3.5–5.2)
ALT: 21 U/L (ref 0–53)
AST: 19 U/L (ref 0–37)
Alkaline Phosphatase: 48 U/L (ref 39–117)
BILIRUBIN TOTAL: 0.7 mg/dL (ref 0.2–1.2)
Bilirubin, Direct: 0.2 mg/dL (ref 0.0–0.3)
TOTAL PROTEIN: 6.7 g/dL (ref 6.0–8.3)

## 2015-03-16 LAB — LIPID PANEL
CHOL/HDL RATIO: 3
Cholesterol: 113 mg/dL (ref 0–200)
HDL: 40.6 mg/dL (ref >39.00)
LDL CALC: 56 mg/dL (ref 0–99)
NonHDL: 72.4
Triglycerides: 84 mg/dL (ref 0.0–149.0)
VLDL: 16.8 mg/dL (ref 0.0–40.0)

## 2015-04-05 ENCOUNTER — Encounter: Payer: Self-pay | Admitting: Cardiovascular Disease

## 2015-04-05 ENCOUNTER — Ambulatory Visit (INDEPENDENT_AMBULATORY_CARE_PROVIDER_SITE_OTHER): Payer: Medicare Other | Admitting: Cardiovascular Disease

## 2015-04-05 VITALS — BP 120/84 | HR 82 | Ht 74.0 in | Wt 256.0 lb

## 2015-04-05 DIAGNOSIS — R5383 Other fatigue: Secondary | ICD-10-CM

## 2015-04-05 DIAGNOSIS — I2510 Atherosclerotic heart disease of native coronary artery without angina pectoris: Secondary | ICD-10-CM

## 2015-04-05 DIAGNOSIS — E785 Hyperlipidemia, unspecified: Secondary | ICD-10-CM | POA: Diagnosis not present

## 2015-04-05 NOTE — Progress Notes (Signed)
Cardiology Office Note   Date:  04/05/2015   ID:  Tyler Bradley, DOB 21-Dec-1950, MRN [id number]  PCP:  Donnajean Lopes, MD  Cardiologist:  Sherren Mocha, MD    Chief Complaint  Patient presents with  . Fatigue    History of Present Illness: Tyler Bradley is a 64 y.o. male who presents for follow-up of CAD. The patient has coronary artery disease. He presented in 2007 with acute coronary syndrome. He underwent cardiac catheterization in [facility]. At that time he was treated with overlapping bare-metal stents in his right coronary artery. He was treated with 4.0 mm MultiLink vision stents.  The patient complains of generalized fatigue. He has not felt well of late. He's also had a cough with associated headache when he coughs. He thinks this may be related to sinus infection. He denies fever or chills. States had no chest pain or pressure. Dating back to his acute coronary syndrome, he really did not have symptoms of chest pain or pressure. His symptoms at that time included diaphoresis and fatigue.   Past Medical History  Diagnosis Date  . CAD (coronary artery disease)   . HTN (hypertension)   . Hyperlipemia   . Fatigue   . Hepatitis   . History of rheumatic fever   . Heart attack     2007 DR. Burt Knack   . Stroke     04/2012  . OSA (obstructive sleep apnea)     CPAP   . Arthritis     OA   . Cancer     BASAL CELL CARCINOMA    Past Surgical History  Procedure Laterality Date  . Basal cell carcinoma excision      from forehead  . Umbilical hernia repair  1998  . Septoplasty  1984  . Total hip arthroplasty Right 11/2011  . Total hip arthroplasty Left 04/2012  . Coronary angioplasty with stent placement  2007  . Parotidectomy Left 09/04/2014    Procedure: PAROTIDECTOMY;  Surgeon: Melida Quitter, MD;  Location: [facility] OR;  Service: ENT;  Laterality: Left;    Current Outpatient Prescriptions  Medication Sig Dispense Refill  . aspirin 325 MG  tablet Take 325 mg by mouth 2 (two) times daily.     . Cholecalciferol (VITAMIN D3) 5000 UNITS CAPS Take 5,000 Units by mouth daily.    . Coenzyme Q10 300 MG CAPS Take 300 mg by mouth 2 (two) times daily.     . CRESTOR 10 MG tablet TAKE 1 TABLET BY MOUTH 3 TIMES PER WEEK 36 tablet 0  . ferrous sulfate 325 (65 FE) MG tablet Take 325 mg by mouth daily with breakfast.    . latanoprost (XALATAN) 0.005 % ophthalmic solution Place 1 drop into both eyes at bedtime.    . metoprolol succinate (TOPROL-XL) 25 MG 24 hr tablet Take 25 mg by mouth daily.      . Multiple Vitamins-Minerals (ICAPS PO) Take 1 tablet by mouth daily.    . Omega-3 Fatty Acids (FISH OIL) 1000 MG CAPS Take 1 capsule by mouth daily.    . OMEGA-3 KRILL OIL PO Take 1 capsule by mouth daily.     . Testosterone 10 MG/ACT (2%) GEL Place onto the skin daily.    Marland Kitchen triamterene-hydrochlorothiazide (MAXZIDE-25) 37.5-25 MG per tablet Take 1 tablet by mouth daily.      . vitamin E 400 UNIT capsule Take 400 Units by mouth 2 (two) times daily.     No current facility-administered medications  for this visit.    Allergies:   Sulfa antibiotics; Sulfonamide derivatives; and Tape   Social History:  The patient  reports that he has never smoked. He does not have any smokeless tobacco history on file. He reports that he drinks alcohol. He reports that he does not use illicit drugs.   Family History:  The patient's  family history includes Breast cancer in his mother; Diabetes in his mother; Hodgkin's lymphoma in his father; Other in his paternal grandmother.    ROS:  Please see the history of present illness.  Otherwise, review of systems is positive for fatigue, sinus congestion, headache especially with coughing.  All other systems are reviewed and negative.    PHYSICAL EXAM: VS:  BP 120/84 mmHg  Pulse 82  Ht 6\' 2"  (1.88 m)  Wt 256 lb (116.121 kg)  BMI 32.85 kg/m2 , BMI Body mass index is 32.85 kg/(m^2). GEN: Well nourished, well developed,  pleasant overweight male in no acute distress HEENT: normal Neck: no JVD, no masses. No carotid bruits Cardiac: RRR without murmur or gallop                Respiratory:  clear to auscultation bilaterally, normal work of breathing GI: soft, nontender, nondistended, + BS MS: no deformity or atrophy Ext: no pretibial edema, pedal pulses 2+= bilaterally Skin: warm and dry, no rash Neuro:  Strength and sensation are intact Psych: euthymic mood, full affect  EKG:  EKG is ordered today. The ekg ordered today shows NSR 82 bpm, within normal limits  Recent Labs: 08/27/2014: BUN 20; Creatinine 1.14; Hemoglobin 16.3; Platelets 162; Potassium 3.8; Sodium 138 03/16/2015: ALT 21   Lipid Panel     Component Value Date/Time   CHOL 113 03/16/2015 0919   TRIG 84.0 03/16/2015 0919   HDL 40.60 03/16/2015 0919   CHOLHDL 3 03/16/2015 0919   VLDL 16.8 03/16/2015 0919   LDLCALC 56 03/16/2015 0919      Wt Readings from Last 3 Encounters:  04/05/15 256 lb (116.121 kg)  09/04/14 255 lb (115.667 kg)  06/16/14 262 lb (118.842 kg)     ASSESSMENT AND PLAN: 1.  CAD, native vessel, without symptoms of angina: The patient does complain of excessive fatigue. He has about 10 years out from his most recent PCI procedure when he was treated with bare-metal stents. Since he had no chest pain at presentation, I think it is reasonable to proceed with an exercise Myoview stress test to evaluate for inducible ischemia as a cause of his generalized fatigue. He will continue his current medical program.  2. Essential HTN: Blood pressure is controlled on current medical program which was reviewed.  3. Hyperlipidemia: Lipids are excellent. Recent LDL is 56. I reviewed his lab work with him in the office today. He continues to tolerate Crestor 3 days per week.  4. Obesity: Reviewed specific strategies and goals regarding weight loss.  Current medicines are reviewed with the patient today.  The patient does not have  concerns regarding medicines.  Labs/ tests ordered today include:   Orders Placed This Encounter  Procedures  . Myocardial Perfusion Imaging  . EKG 12-Lead    Disposition:   FU one year  Signed, Sherren Mocha, MD  04/05/2015 5:53 PM    [facility] [address], [place], [place]  [address] Phone: [phone]; Fax: [phone]

## 2015-04-05 NOTE — Patient Instructions (Signed)
Medication Instructions:  Your physician recommends that you continue on your current medications as directed. Please refer to the Current Medication list given to you today.  Labwork: No new orders.  Testing/Procedures: Your physician has requested that you have an exercise stress myoview. For further information please visit [email or web address]. Please follow instruction sheet, as given.  Follow-Up: Your physician wants you to follow-up in: 1 YEAR with Dr Burt Knack.  You will receive a reminder letter in the mail two months in advance. If you don't receive a letter, please call our office to schedule the follow-up appointment.  Any Other Special Instructions Will Be Listed Below (If Applicable).

## 2015-04-14 ENCOUNTER — Telehealth (HOSPITAL_COMMUNITY): Payer: Self-pay

## 2015-04-14 NOTE — Telephone Encounter (Signed)
Patient given detailed instructions per Myocardial Perfusion Study Information Sheet for test on 04-15-2015 at 7:45am. Patient verbalized understanding. Tyler Bradley, [name] A

## 2015-04-15 ENCOUNTER — Ambulatory Visit (HOSPITAL_COMMUNITY): Payer: Medicare Other | Attending: Cardiovascular Disease

## 2015-04-15 DIAGNOSIS — I2510 Atherosclerotic heart disease of native coronary artery without angina pectoris: Secondary | ICD-10-CM | POA: Insufficient documentation

## 2015-04-15 DIAGNOSIS — R5383 Other fatigue: Secondary | ICD-10-CM | POA: Diagnosis not present

## 2015-04-15 DIAGNOSIS — Z955 Presence of coronary angioplasty implant and graft: Secondary | ICD-10-CM | POA: Insufficient documentation

## 2015-04-15 DIAGNOSIS — I252 Old myocardial infarction: Secondary | ICD-10-CM | POA: Insufficient documentation

## 2015-04-15 DIAGNOSIS — E785 Hyperlipidemia, unspecified: Secondary | ICD-10-CM | POA: Insufficient documentation

## 2015-04-15 DIAGNOSIS — I10 Essential (primary) hypertension: Secondary | ICD-10-CM | POA: Diagnosis not present

## 2015-04-15 LAB — MYOCARDIAL PERFUSION IMAGING
CHL CUP MPHR: 155 {beats}/min
CHL CUP STRESS STAGE 1 GRADE: 0 %
CHL CUP STRESS STAGE 1 HR: 80 {beats}/min
CHL CUP STRESS STAGE 1 SBP: 132 mmHg
CHL CUP STRESS STAGE 1 SPEED: 0 mph
CHL CUP STRESS STAGE 2 HR: 66 {beats}/min
CHL CUP STRESS STAGE 2 SPEED: 0 mph
CHL CUP STRESS STAGE 3 GRADE: 0 %
CHL CUP STRESS STAGE 3 HR: 66 {beats}/min
CHL CUP STRESS STAGE 3 SPEED: 0 mph
CHL CUP STRESS STAGE 4 HR: 104 {beats}/min
CHL CUP STRESS STAGE 5 SBP: 145 mmHg
CHL CUP STRESS STAGE 7 DBP: 68 mmHg
CHL CUP STRESS STAGE 7 GRADE: 0 %
CHL CUP STRESS STAGE 7 HR: 136 {beats}/min
CHL CUP STRESS STAGE 7 SPEED: 0 mph
CSEPED: 7 min
CSEPEDS: 30 s
CSEPEW: 9.2 METS
CSEPPHR: 153 {beats}/min
LHR: 0.34
LV dias vol: 128 mL
LV sys vol: 58 mL
NUC STRESS EF: 54 %
NUC STRESS TID: 1.06
Percent HR: 100 %
Percent of predicted max HR: 98 %
Rest HR: 63 {beats}/min
SDS: 1
SRS: 6
SSS: 7
Stage 1 DBP: 92 mmHg
Stage 2 DBP: 91 mmHg
Stage 2 Grade: 0 %
Stage 2 SBP: 132 mmHg
Stage 4 DBP: 69 mmHg
Stage 4 Grade: 10 %
Stage 4 SBP: 150 mmHg
Stage 4 Speed: 1.7 mph
Stage 5 DBP: 69 mmHg
Stage 5 Grade: 12 %
Stage 5 HR: 126 {beats}/min
Stage 5 Speed: 2.5 mph
Stage 6 Grade: 14 %
Stage 6 HR: 153 {beats}/min
Stage 6 Speed: 3.4 mph
Stage 7 SBP: 138 mmHg
Stage 8 DBP: 78 mmHg
Stage 8 Grade: 0 %
Stage 8 SBP: 131 mmHg
Stage 8 Speed: 0 mph

## 2015-04-15 MED ORDER — TECHNETIUM TC 99M SESTAMIBI GENERIC - CARDIOLITE
30.0000 | Freq: Once | INTRAVENOUS | Status: AC | PRN
  Administered 2015-04-15: 30 via INTRAVENOUS

## 2015-04-15 MED ORDER — TECHNETIUM TC 99M SESTAMIBI GENERIC - CARDIOLITE
10.0000 | Freq: Once | INTRAVENOUS | Status: AC | PRN
  Administered 2015-04-15: 10 via INTRAVENOUS

## 2015-05-13 ENCOUNTER — Telehealth: Payer: Self-pay | Admitting: Cardiovascular Disease

## 2015-05-13 NOTE — Telephone Encounter (Signed)
I spoke with Tyler Bradley and made her aware that the pt does not require SBE at this time from a cardiac standpoint.

## 2015-05-13 NOTE — Telephone Encounter (Signed)
1. What dental office are you calling from? [facility]  2. What is your office phone and fax number? [phone] fax and phone is  [phone]  3. What type of procedure is the patient having performed? Regular dental clean  4. What date is procedure scheduled? For future appt   5. What is your question (ex. Antibiotics prior to procedure, holding medication-we need to know how long dentist wants pt to hold med)? Need to know if pt need to be premedicated.  6.

## 2015-07-28 ENCOUNTER — Other Ambulatory Visit: Payer: Self-pay | Admitting: Cardiovascular Disease

## 2015-08-10 ENCOUNTER — Other Ambulatory Visit: Payer: Self-pay | Admitting: Cardiovascular Disease

## 2015-10-06 ENCOUNTER — Institutional Professional Consult (permissible substitution): Payer: Medicare Other | Admitting: Pulmonary Disease

## 2015-10-22 ENCOUNTER — Encounter: Payer: Self-pay | Admitting: Internal Medicine

## 2015-10-22 ENCOUNTER — Ambulatory Visit (INDEPENDENT_AMBULATORY_CARE_PROVIDER_SITE_OTHER): Payer: Medicare Other | Admitting: Internal Medicine

## 2015-10-22 VITALS — BP 140/80 | HR 94 | Ht 74.0 in | Wt 262.0 lb

## 2015-10-22 DIAGNOSIS — G4733 Obstructive sleep apnea (adult) (pediatric): Secondary | ICD-10-CM | POA: Diagnosis not present

## 2015-10-22 NOTE — Assessment & Plan Note (Signed)
Complicated by osa/ hbp/ hyperlipidemia   Body mass index is 33.62    No results found for: TSH   Contributing to gerd tendency/ doe/reviewed the need and the process to achieve and maintain neg calorie balance > defer f/u primary care including intermittently monitoring thyroid status

## 2015-10-22 NOTE — Progress Notes (Signed)
Subjective:     Patient ID: Tyler Bradley, male   DOB: 01-28-1951,    MRN: [id number]  HPI  89 yowm never smoker previously followed by Dr Gwenette Greet self referred to pulmonary clinic for sleep 10/22/2015    10/22/2015 1st [facility] office visit/ [facility]   Chief Complaint  Patient presents with  . Sleep Consult    Pt last seen by Dr. Gwenette Greet in 2012. He states doing well with CPAP and denies any issues with machine or mask.    Sleep Questionnaire: What time do you typically go to bed?( Between what hours) 11-12pm How long does it take you to fall asleep? 5 mins How many times during the night do you wake up? N/a What time do you get out of bed to start your day? 0700 Do you drive or operate heavy machinery in your occupation? no How much has your weight changed (down) over the past two years? (In pounds) 10 lb Have you ever had a sleep study before? Yes If yes, location of study? [facility] If yes, date of study? 05/1999 Do you currently use CPAP? Yes If so, what pressure? 12  Do you wear oxygen at any time? No  Epworth score 2    Uses it religiously and happy with machine set up   No  Am HA or   cough or cp or chest tightness, subjective wheeze or overt sinus or hb symptoms. No unusual exp hx or h/o childhood pna/ asthma or knowledge of premature birth.  Sleeping ok without nocturnal  or early am exacerbation  of respiratory  c/o's or need for noct saba. Also denies any obvious fluctuation of symptoms with weather or environmental changes or other aggravating or alleviating factors except as outlined above   Current Medications, Allergies, Complete Past Medical History, Past Surgical History, Family History, and Social History were reviewed in [employer] record.  ROS  The following are not active complaints unless bolded sore throat, dysphagia, dental problems, itching, sneezing,  nasal congestion or excess/ purulent secretions, ear ache,   fever, chills, sweats,  unintended wt loss, classically pleuritic or exertional cp, hemoptysis,  orthopnea pnd or leg swelling, presyncope, palpitations, abdominal pain, anorexia, nausea, vomiting, diarrhea  or change in bowel or bladder habits, change in stools or urine, dysuria,hematuria,  rash, arthralgias, visual complaints, headache, numbness, weakness or ataxia or problems with walking or coordination,  change in mood/affect or memory.             Review of Systems     Objective:   Physical Exam    obese amb wm nad  Wt Readings from Last 3 Encounters:  10/22/15 262 lb (118.842 kg)  04/15/15 254 lb (115.214 kg)  04/05/15 256 lb (116.121 kg)    Vital signs reviewed    HEENT: nl dentition, turbinates, and oropharynx. Nl external ear canals without cough reflex Modified Mallampati Score = 3     NECK :  without JVD/Nodes/TM/ nl carotid upstrokes bilaterally   LUNGS: no acc muscle use, clear to A and P bilaterally without cough on insp or exp maneuvers   CV:  RRR  no s3 or murmur or increase in P2, no edema   ABD:  soft and nontender with nl excursion in the supine position. No bruits or organomegaly, bowel sounds nl  MS:  warm without deformities, calf tenderness, cyanosis or clubbing  SKIN: warm and dry without lesions    NEURO:  alert, approp, no deficits  Assessment:

## 2015-10-22 NOTE — Assessment & Plan Note (Addendum)
NPSG 06/02/1999:  AHI 41/hr> cpap 12 > complete elimination of all symptoms  - download requested 10/22/2015 >>>   Hx suggests his present setting are effective and he uses it consistently and if able to lose wt  may be able to dial the pressure back down at some point and in meantime set him up for maint on his cpap and supplies with download when available  Total time devoted to counseling  = 25/50m review case with pt/ discussion of options/alternatives/ giving and going over instructions (see avs)

## 2015-10-22 NOTE — Patient Instructions (Signed)
Please see patient coordinator before you leave today  to schedule maintenance of cpap machine/ supplies/ download   Please schedule a follow up visit in 6  months but call sooner if needed with sleep medicine doctor

## 2016-03-06 DIAGNOSIS — H401111 Primary open-angle glaucoma, right eye, mild stage: Secondary | ICD-10-CM | POA: Diagnosis not present

## 2016-03-06 DIAGNOSIS — H26102 Unspecified traumatic cataract, left eye: Secondary | ICD-10-CM | POA: Diagnosis not present

## 2016-03-06 DIAGNOSIS — H401122 Primary open-angle glaucoma, left eye, moderate stage: Secondary | ICD-10-CM | POA: Diagnosis not present

## 2016-03-09 DIAGNOSIS — I10 Essential (primary) hypertension: Secondary | ICD-10-CM | POA: Diagnosis not present

## 2016-03-09 DIAGNOSIS — R8299 Other abnormal findings in urine: Secondary | ICD-10-CM | POA: Diagnosis not present

## 2016-03-09 DIAGNOSIS — Z125 Encounter for screening for malignant neoplasm of prostate: Secondary | ICD-10-CM | POA: Diagnosis not present

## 2016-03-09 DIAGNOSIS — E784 Other hyperlipidemia: Secondary | ICD-10-CM | POA: Diagnosis not present

## 2016-03-16 DIAGNOSIS — E668 Other obesity: Secondary | ICD-10-CM | POA: Diagnosis not present

## 2016-03-16 DIAGNOSIS — I2510 Atherosclerotic heart disease of native coronary artery without angina pectoris: Secondary | ICD-10-CM | POA: Diagnosis not present

## 2016-03-16 DIAGNOSIS — Z1389 Encounter for screening for other disorder: Secondary | ICD-10-CM | POA: Diagnosis not present

## 2016-03-16 DIAGNOSIS — E298 Other testicular dysfunction: Secondary | ICD-10-CM | POA: Diagnosis not present

## 2016-03-16 DIAGNOSIS — G4733 Obstructive sleep apnea (adult) (pediatric): Secondary | ICD-10-CM | POA: Diagnosis not present

## 2016-03-16 DIAGNOSIS — Z0000 Encounter for general adult medical examination without abnormal findings: Secondary | ICD-10-CM | POA: Diagnosis not present

## 2016-03-16 DIAGNOSIS — M545 Low back pain: Secondary | ICD-10-CM | POA: Diagnosis not present

## 2016-03-16 DIAGNOSIS — N529 Male erectile dysfunction, unspecified: Secondary | ICD-10-CM | POA: Diagnosis not present

## 2016-03-16 DIAGNOSIS — I10 Essential (primary) hypertension: Secondary | ICD-10-CM | POA: Diagnosis not present

## 2016-03-16 DIAGNOSIS — E784 Other hyperlipidemia: Secondary | ICD-10-CM | POA: Diagnosis not present

## 2016-03-28 DIAGNOSIS — N400 Enlarged prostate without lower urinary tract symptoms: Secondary | ICD-10-CM | POA: Diagnosis not present

## 2016-03-29 DIAGNOSIS — Z1212 Encounter for screening for malignant neoplasm of rectum: Secondary | ICD-10-CM | POA: Diagnosis not present

## 2016-03-31 DIAGNOSIS — R3129 Other microscopic hematuria: Secondary | ICD-10-CM | POA: Diagnosis not present

## 2016-03-31 DIAGNOSIS — N5201 Erectile dysfunction due to arterial insufficiency: Secondary | ICD-10-CM | POA: Diagnosis not present

## 2016-03-31 DIAGNOSIS — E291 Testicular hypofunction: Secondary | ICD-10-CM | POA: Diagnosis not present

## 2016-04-24 ENCOUNTER — Ambulatory Visit (INDEPENDENT_AMBULATORY_CARE_PROVIDER_SITE_OTHER): Payer: Medicare Other | Admitting: Internal Medicine

## 2016-04-24 ENCOUNTER — Encounter: Payer: Self-pay | Admitting: Internal Medicine

## 2016-04-24 VITALS — BP 128/78 | HR 67 | Ht 74.0 in | Wt 264.0 lb

## 2016-04-24 DIAGNOSIS — G4733 Obstructive sleep apnea (adult) (pediatric): Secondary | ICD-10-CM

## 2016-04-24 IMAGING — CT CT NECK W/ CM
4 of 5 series · 16 of 33 positions shown, 19 images · IV contrast (75CC OMNI 300)
Comparison: None.

CLINICAL DATA: Left parotid swelling, 2 weeks duration.

BUN and creatinine were obtained on site at [HOSPITAL] at
[HOSPITAL].Results: BUN 21 mg/dL, Creatinine 1.3 mg/dL.
EXAM:
CT NECK WITH CONTRAST
TECHNIQUE: Multidetector CT imaging of the neck was performed using the
standard protocol following the bolus administration of intravenous
contrast.
CONTRAST:  100mL OMNIPAQUE IOHEXOL 300 MG/ML  SOLN

[Series 2: axial neck · axial · 0.44mm/px · z∈[-190,-130]mm · 2 of 98 slices shown]
[im 25/98  bone]
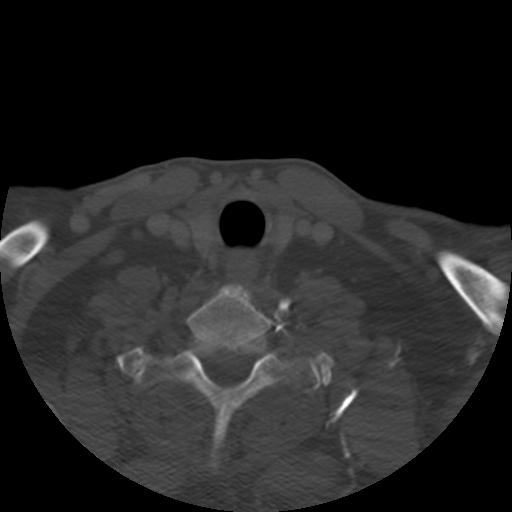
[im 49/98  bone]
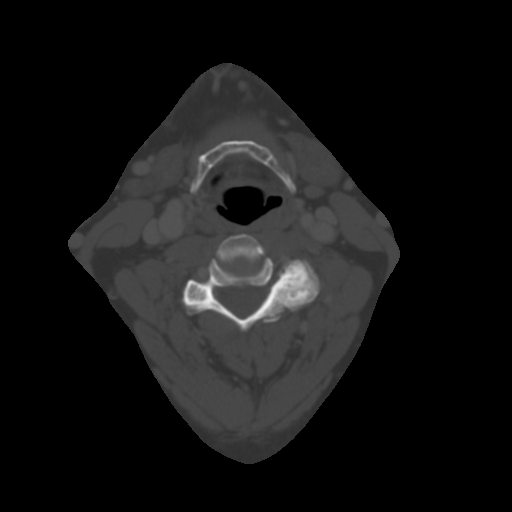

[Series 400: sag · sagittal · 0.44mm/px · 5 of 102 slices shown, 6 images]
[im 34/102  bone]
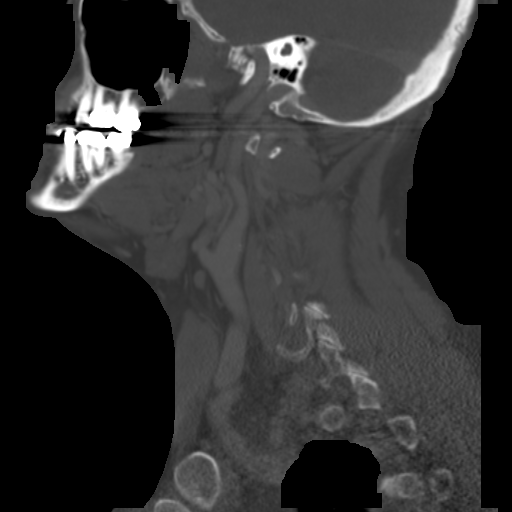
[im 43/102  bone]
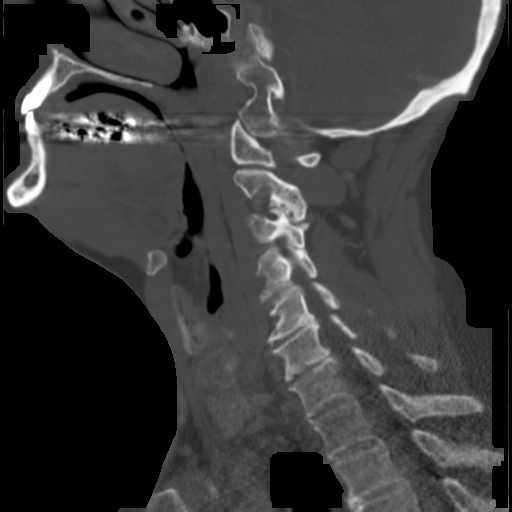
[im 51/102  soft-tissue]
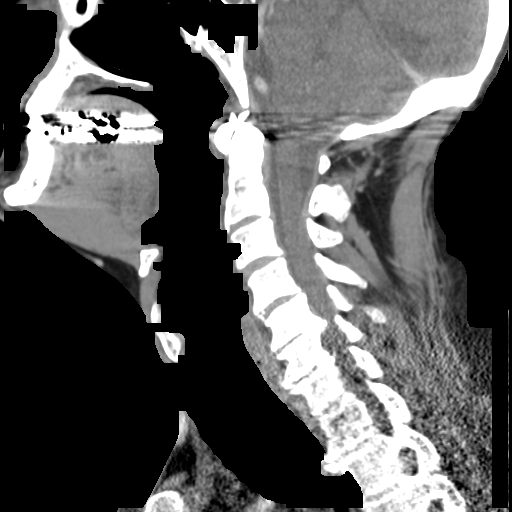
[im 51/102  bone]
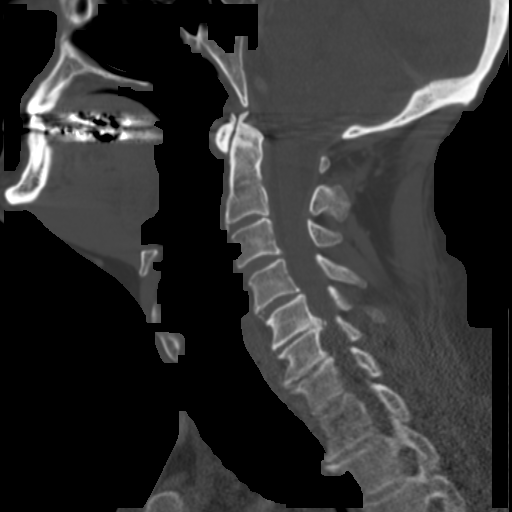
[im 59/102  bone]
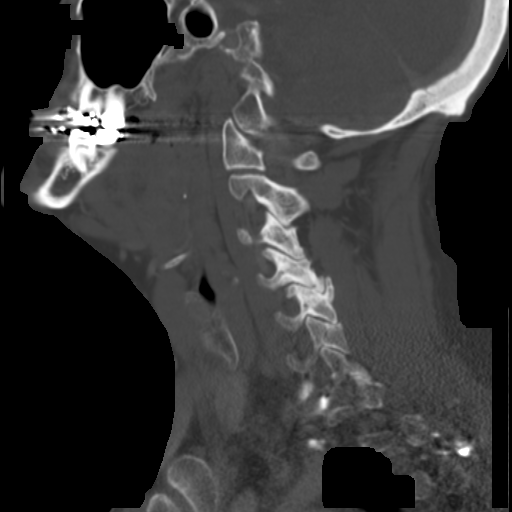
[im 68/102  bone]
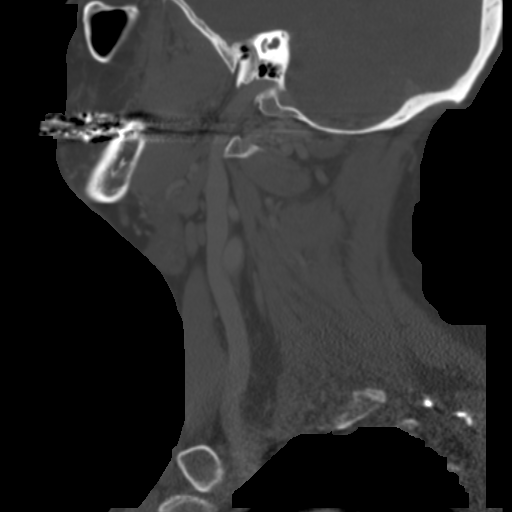

[Series 401: cor · coronal · 0.44mm/px · 3 of 118 slices shown]
[im 32/118  bone]
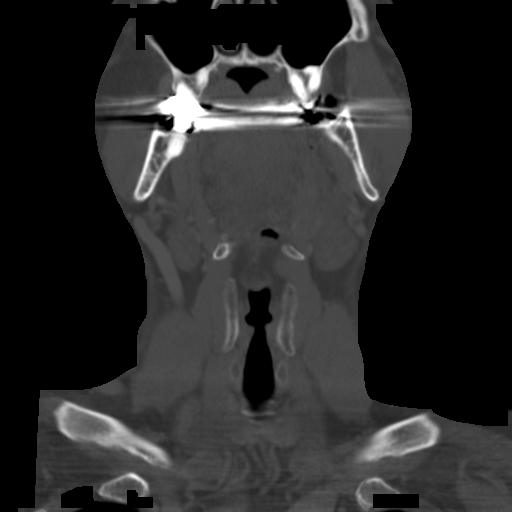
[im 50/118  bone]
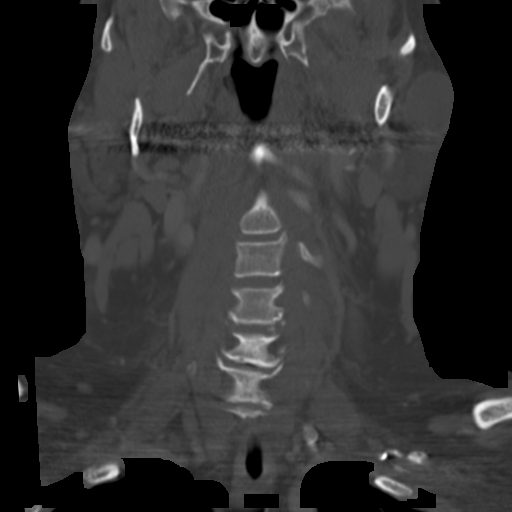
[im 68/118  bone]
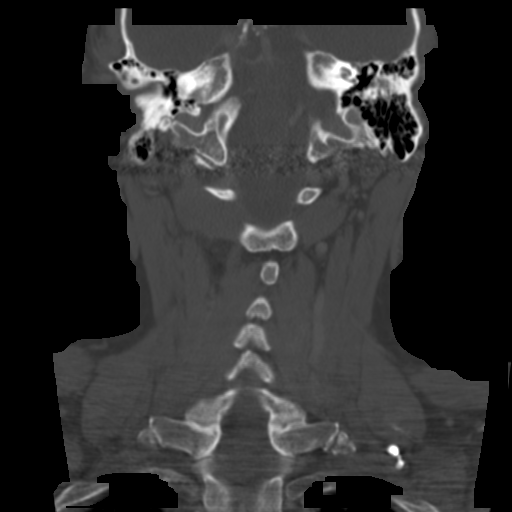

[Series 402: angled axials · axial · 0.44mm/px · z∈[-260,-77]mm · 6 of 144 slices shown, 8 images]
[im 21/144  soft-tissue]
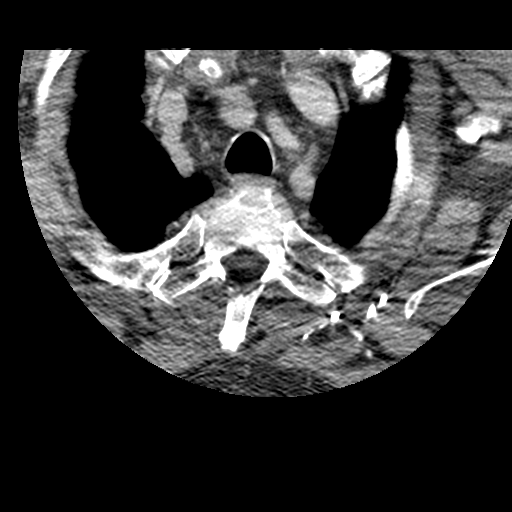
[im 21/144  bone]
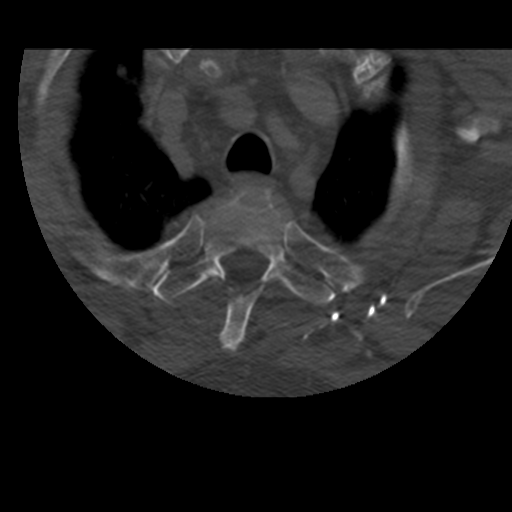
[im 41/144  bone]
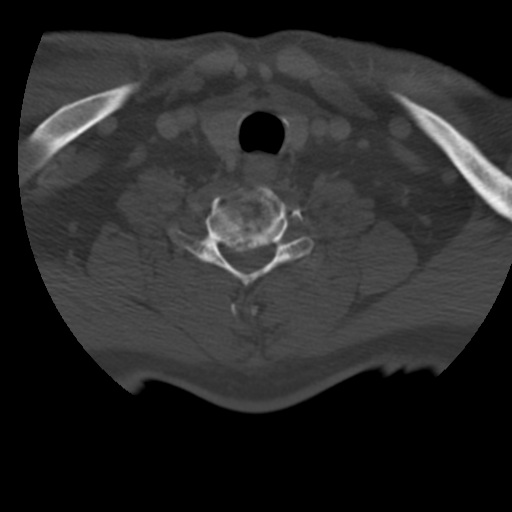
[im 62/144  bone]
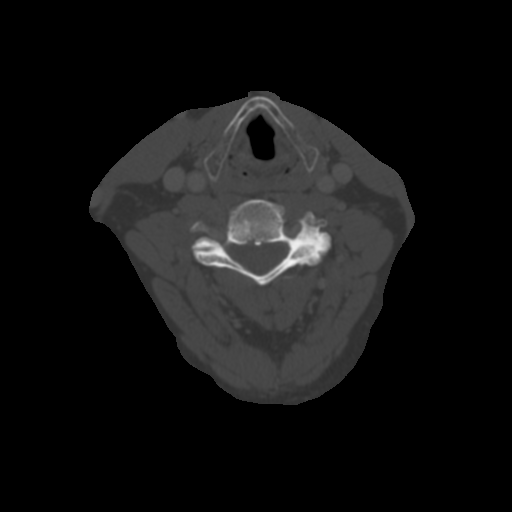
[im 82/144  bone]
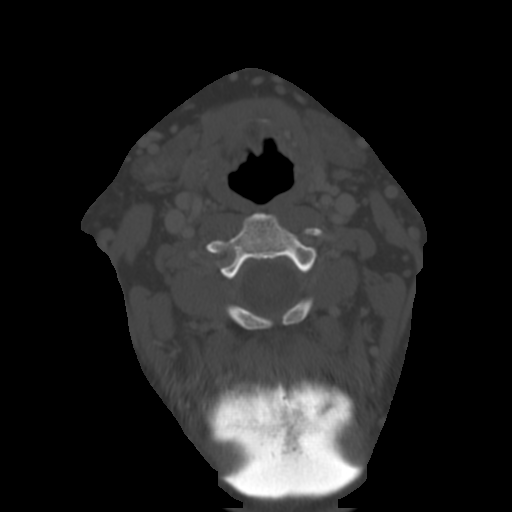
[im 103/144  soft-tissue]
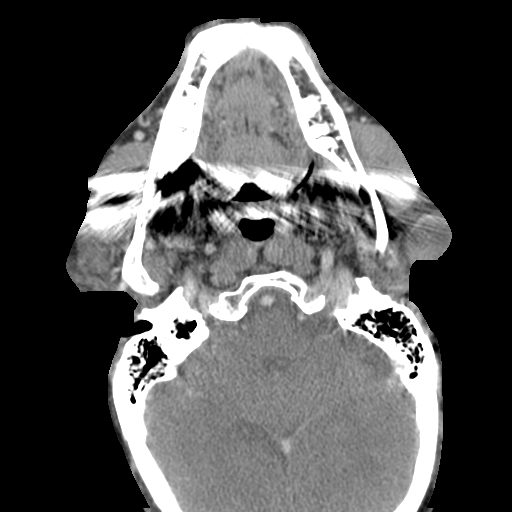
[im 103/144  bone]
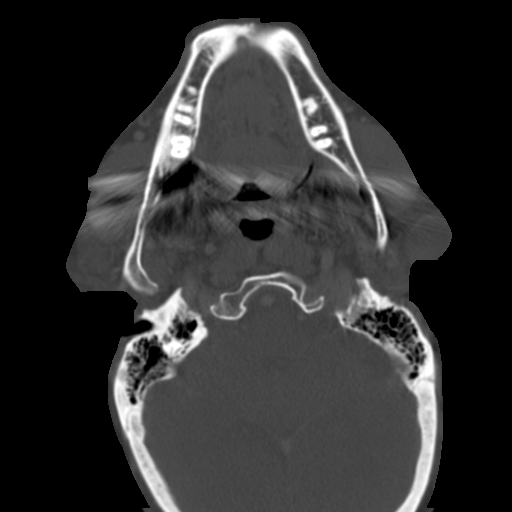
[im 123/144  bone]
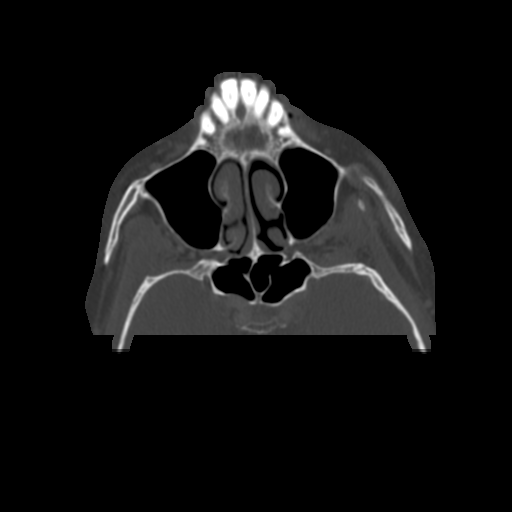

[16 of 33 positions shown; findings below may reference images not displayed]

FINDINGS: Lung apices are clear.  Intracranial contents as seen appear normal.

The right parotid gland is normal. There is a mass within the
superficial lobe of the parotid on the left measuring 2.9 cm in
diameter with internal low density and a fluid level. Margins are
well-circumscribed. Submandibular glands are normal. Thyroid gland
is normal. There are no enlarged lymph nodes on either side of the
neck. No mucosal or submucosal lesion is seen. Ordinary degenerative
changes affect the cervical spine.
IMPRESSION: 2.9 cm in diameter well-circumscribed mass within the superficial
lobe of the left parotid gland, showing internal low density and a
fluid level. This could represent a cystic neoplasm or could
represent a retention cyst with internal hemorrhage or debris. First
branchial cleft cyst is considered but felt unlikely.

## 2016-04-24 NOTE — Patient Instructions (Signed)
Order- new DME to continue CPAP 12, mask of choice, humidifier, supplies, Air View     Dx OSA                             Evaluate eligibility for replacement of old machine  Please call as needed

## 2016-04-24 NOTE — Progress Notes (Signed)
  Subjective:    Patient ID: Tyler Bradley, male    DOB: [birth year], 65 y.o.   MRN: [id number]  HPI  05/08/11- Dr Gwenette Greet The pt comes in today for f/u of his known osa.  He is wearing cpap compliantly, and has no issues with mask fit or pressure.  He is overdue for a new mask though.  He feels he is sleeping well, and denies any significant daytime alertness issues with inactivity.  He has not been able to lose weight, and he feels this is due to inactivity from back issues.   04/24/2016-65 year old male followed for OSA, complicated by CAD, obesity, history of parotid neoplasm NPSG- 06/02/1999- AHI 41/ hr CPAP 12/ [facility]> Former [facility] patient; Will need new local DME-wears CPAP nightly. DL attached.     Review of Systems  Constitutional: Positive for fever and unexpected weight change.  HENT: Positive for congestion, rhinorrhea, sneezing, postnasal drip and sinus pressure. Negative for ear pain, nosebleeds, sore throat, trouble swallowing and dental problem.   Eyes: Positive for redness and itching.  Respiratory: Negative for cough, chest tightness, shortness of breath and wheezing.   Cardiovascular: Positive for palpitations and leg swelling.  Gastrointestinal: Negative for nausea and vomiting.  Genitourinary: Negative for dysuria.  Musculoskeletal: Negative for joint swelling.  Skin: Positive for rash.  Neurological: Negative for headaches.  Hematological: Bruises/bleeds easily.  Psychiatric/Behavioral: Negative for dysphoric mood. The patient is not nervous/anxious.        Objective:   Physical Exam Ow male in nad No skin breakdown or pressure necrosis from cpap mask No purulence or discharge noted from nares LE without edema, no cyanosis noted.  Alert, does not appear sleepy, moves all 4        Assessment & Plan:

## 2016-05-05 ENCOUNTER — Encounter: Payer: Self-pay | Admitting: Cardiovascular Disease

## 2016-05-05 ENCOUNTER — Ambulatory Visit (INDEPENDENT_AMBULATORY_CARE_PROVIDER_SITE_OTHER): Payer: Medicare Other | Admitting: Cardiovascular Disease

## 2016-05-05 VITALS — BP 110/68 | HR 74 | Ht 74.0 in | Wt 264.8 lb

## 2016-05-05 DIAGNOSIS — I2510 Atherosclerotic heart disease of native coronary artery without angina pectoris: Secondary | ICD-10-CM | POA: Diagnosis not present

## 2016-05-05 DIAGNOSIS — R5383 Other fatigue: Secondary | ICD-10-CM

## 2016-05-05 DIAGNOSIS — G4733 Obstructive sleep apnea (adult) (pediatric): Secondary | ICD-10-CM | POA: Diagnosis not present

## 2016-05-05 DIAGNOSIS — R0602 Shortness of breath: Secondary | ICD-10-CM | POA: Diagnosis not present

## 2016-05-05 NOTE — Progress Notes (Signed)
Cardiology Office Note Date:  05/05/2016   ID:  [name], DOB 07/04/1951, MRN [id number]  PCP:  Donnajean Lopes, MD  Cardiologist:  Sherren Mocha, MD    Chief Complaint  Patient presents with  . CAD native vessel    Has occasional shortness of breath. No chest pain, le edema, or claudication  . Hyperlipidemia  . essential hypertension     History of Present Illness: Tyler Bradley is a 65 y.o. male who presents for follow-up of CAD. He presented in 2007 with acute coronary syndrome and underwent cardiac catheterization in [place], [place]. He underwent PCI with overlapping MultiLink vision bare-metal stents in the right coronary artery. His symptoms at the time of presentation included diaphoresis, pallor, and right shoulder/upper back discomfort. This has not recurred since the time of his PCI procedure.   His biggest concern is generalized fatigue. He feels heavy legs with climbing stairs. He does admit to exertional dyspnea. He complains of heart palpitations when he first wakes up. He has obstructive sleep apnea and has consistently worn CPAP x > 10 years. Today, he denies symptoms of orthopnea, PND, dizziness, or syncope. He complains of leg swelling especially late in the day.   Past Medical History  Diagnosis Date  . CAD (coronary artery disease)   . HTN (hypertension)   . Hyperlipemia   . Fatigue   . Hepatitis   . History of rheumatic fever   . Heart attack ([place])     2007 DR. Burt Knack   . Stroke ([name])     04/2012  . OSA (obstructive sleep apnea)     CPAP   . Arthritis     OA   . Cancer ([place])     BASAL CELL CARCINOMA    Past Surgical History  Procedure Laterality Date  . Basal cell carcinoma excision      from forehead  . Umbilical hernia repair  1998  . Septoplasty  1984  . Total hip arthroplasty Right 11/2011  . Total hip arthroplasty Left 04/2012  . Coronary angioplasty with stent placement  2007  . Parotidectomy Left  09/04/2014    Procedure: PAROTIDECTOMY;  Surgeon: Melida Quitter, MD;  Location: [facility] OR;  Service: ENT;  Laterality: Left;    Current Outpatient Prescriptions  Medication Sig Dispense Refill  . aspirin 325 MG tablet Take 325 mg by mouth 2 (two) times daily.     . Cholecalciferol (VITAMIN D3) 5000 UNITS CAPS Take 5,000 Units by mouth daily.    . Coenzyme Q10 300 MG CAPS Take 300 mg by mouth 2 (two) times daily.     . CRESTOR 10 MG tablet TAKE 1 TABLET BY MOUTH 3 TIMES PER WEEK 36 tablet 3  . latanoprost (XALATAN) 0.005 % ophthalmic solution Place 1 drop into both eyes at bedtime.    . metoprolol succinate (TOPROL-XL) 25 MG 24 hr tablet Take 25 mg by mouth daily.      . Multiple Vitamins-Minerals (ICAPS PO) Take 1 tablet by mouth daily.    . Omega-3 Fatty Acids (FISH OIL) 1000 MG CAPS Take 1 capsule by mouth daily.    . OMEGA-3 KRILL OIL PO Take 1 capsule by mouth daily.     . TESTOSTERONE TD Place 1 application onto the skin daily.    Marland Kitchen triamterene-hydrochlorothiazide (MAXZIDE-25) 37.5-25 MG per tablet Take 1 tablet by mouth daily.      . vitamin E 400 UNIT capsule Take 400 Units by mouth 2 (two) times daily.  No current facility-administered medications for this visit.    Allergies:   Latex; Sulfa antibiotics; Sulfonamide derivatives; and Tape   Social History:  The patient  reports that he has never smoked. He does not have any smokeless tobacco history on file. He reports that he drinks alcohol. He reports that he does not use illicit drugs.   Family History:  The patient's family history includes Breast cancer in his mother; Diabetes in his mother; Hodgkin's lymphoma in his father; Other in his paternal grandmother.    ROS:  Please see the history of present illness.  Otherwise, review of systems is positive for leg swelling, shortness of breath with activity, back pain, rash, easy bruising, fatigue, leg pain, irregular heartbeats, balance problems.  All other systems are reviewed and  negative.    PHYSICAL EXAM: VS:  BP 110/68 mmHg  Pulse 74  Ht 6\' 2"  (1.88 m)  Wt 264 lb 12.8 oz (120.112 kg)  BMI 33.98 kg/m2 , BMI Body mass index is 33.98 kg/(m^2). GEN: Well nourished, well developed, in no acute distress HEENT: normal Neck: no JVD, no masses. No carotid bruits Cardiac: RRR without murmur or gallop                Respiratory:  clear to auscultation bilaterally, normal work of breathing GI: soft, nontender, nondistended, + BS MS: no deformity or atrophy Ext: trace pretibial edema, pedal pulses 2+= bilaterally, varicosities present Skin: warm and dry, no rash Neuro:  Strength and sensation are intact Psych: euthymic mood, full affect  EKG:  EKG is ordered today. The ekg ordered today shows normal sinus rhythm 74 bpm, within normal limits.  Recent Labs: No results found for requested labs within last 365 days.   Lipid Panel     Component Value Date/Time   CHOL 113 03/16/2015 0919   TRIG 84.0 03/16/2015 0919   HDL 40.60 03/16/2015 0919   CHOLHDL 3 03/16/2015 0919   VLDL 16.8 03/16/2015 0919   LDLCALC 56 03/16/2015 0919      Wt Readings from Last 3 Encounters:  05/05/16 264 lb 12.8 oz (120.112 kg)  04/24/16 264 lb (119.75 kg)  10/22/15 262 lb (118.842 kg)    Nuclear Scan 04-15-2015: Stress Findings    ECG Baseline ECG is normal.  There was no ST segment deviation noted during stress.  Arrhythmias during stress: none.  Arrhythmias during recovery: none.  There were no significant arrhythmias noted during the test.  ECG was uninterpretable.   Stress Findings The patient exercised following the Bruce protocol.     The test was stopped because the patient complained of fatigue.   The patient reported shortness of breath and fatigue during the stress test. The patient experienced no angina during the stress test.   Recovery time: 6 minutes.  [facility] Treadmill Score: low risk Normal exercise stress ECG    Stress Measurements    Baseline  Vitals  Rest HR 63 bpm    Rest BP 132/92 mmHg    Exercise Time  Exercise duration (min) 7 min    Exercise duration (sec) 30 sec    Peak Stress Vitals  Peak HR 153 BPM    Peak BP 161/70 mmHg    Exercise Data  MPHR 155 bpm    Percent HR 100 %    Estimated workload 9.2 METS         Nuclear Stress Measurements    LV sys vol 58 mL    TID 1.06  LV dias vol 128 mL    Nuc Stress EF 54 %    LHR 0.34     SSS 7     SRS 6     SDS 1            Nuclear Stress Findings    Isotope administration The isotope used for nuclear imaging was Tc23m Sestamibi. Rest isotope was administered on 04/15/2015 at 08:05 with an IV injection of 11 mCi. Rest SPECT images were obtained approximately 45 minutes post tracer injection. Stress isotope was administered on 04/15/2015 at 09:40 with an IV injection of 33 mCi at peak exercise Stress SPECT images were obtained approximately 60 minutes post tracer injection.   Nuclear Study Quality Overall image quality is good.Image quality affected due to patient motion.   Nuclear Measurements Study was gated.    Wall Scoring    Score Index: 1.000 Percent Normal: 100.0%          The left ventricular wall motion is normal.            ASSESSMENT AND PLAN: 1. CAD, native vessel, without symptoms of angina: Nuclear scan 1 year ago was normal. He has symptoms of shortness of breath and fatigue. Will check a treadmill non-imaging stress test. Current medications will be continued. Lifestyle modification discussed.  2. Essential HTN: Blood pressure is well-controlled on current medical program. Discussed sodium restriction.  3. Hyperlipidemia: Most recent lipid panel was reviewed and shows a cholesterol of 145, triglycerides 126, HDL 45, LDL 75.  4. Obesity: Discussed need to increase exercise and work on weight loss. Reviewed his weight trend and he has gained 20 pounds since 2014.  Current medicines are reviewed with the patient today.   The patient does not have concerns regarding medicines.  Labs/ tests ordered today include:   Orders Placed This Encounter  Procedures  . Exercise Tolerance Test  . EKG 12-Lead    Disposition:   FU one year  Signed, Sherren Mocha, MD  05/05/2016 1:35 PM    [facility] [address], [place], [place]  [address] Phone: [phone]; Fax: [phone]

## 2016-05-05 NOTE — Patient Instructions (Signed)
Medication Instructions:  Your physician recommends that you continue on your current medications as directed. Please refer to the Current Medication list given to you today.  Labwork: No new orders.   Testing/Procedures: Your physician has requested that you have an exercise tolerance test. For further information please visit [email or web address]. Please also follow instruction sheet, as given.  Follow-Up: Your physician wants you to follow-up in: 1 YEAR with Dr Burt Knack.  You will receive a reminder letter in the mail two months in advance. If you don't receive a letter, please call our office to schedule the follow-up appointment.   Any Other Special Instructions Will Be Listed Below (If Applicable).     If you need a refill on your cardiac medications before your next appointment, please call your pharmacy.

## 2016-05-08 DIAGNOSIS — G4733 Obstructive sleep apnea (adult) (pediatric): Secondary | ICD-10-CM | POA: Diagnosis not present

## 2016-05-11 ENCOUNTER — Ambulatory Visit (INDEPENDENT_AMBULATORY_CARE_PROVIDER_SITE_OTHER): Payer: Medicare Other

## 2016-05-11 DIAGNOSIS — R0602 Shortness of breath: Secondary | ICD-10-CM

## 2016-05-11 DIAGNOSIS — R5383 Other fatigue: Secondary | ICD-10-CM | POA: Diagnosis not present

## 2016-05-11 DIAGNOSIS — I2510 Atherosclerotic heart disease of native coronary artery without angina pectoris: Secondary | ICD-10-CM

## 2016-05-11 LAB — EXERCISE TOLERANCE TEST
Estimated workload: 8.5 METS
Exercise duration (min): 7 min
Exercise duration (sec): 1 s
MPHR: 154 {beats}/min
Peak HR: 144 {beats}/min
Percent HR: 93 %
RPE: 17
Rest HR: 73 {beats}/min

## 2016-06-07 DIAGNOSIS — G4733 Obstructive sleep apnea (adult) (pediatric): Secondary | ICD-10-CM | POA: Diagnosis not present

## 2016-07-08 DIAGNOSIS — G4733 Obstructive sleep apnea (adult) (pediatric): Secondary | ICD-10-CM | POA: Diagnosis not present

## 2016-07-26 ENCOUNTER — Other Ambulatory Visit: Payer: Self-pay | Admitting: Cardiovascular Disease

## 2016-08-02 ENCOUNTER — Other Ambulatory Visit: Payer: Self-pay | Admitting: Cardiovascular Disease

## 2016-08-08 DIAGNOSIS — G4733 Obstructive sleep apnea (adult) (pediatric): Secondary | ICD-10-CM | POA: Diagnosis not present

## 2016-09-05 DIAGNOSIS — E291 Testicular hypofunction: Secondary | ICD-10-CM | POA: Diagnosis not present

## 2016-09-27 DIAGNOSIS — H401111 Primary open-angle glaucoma, right eye, mild stage: Secondary | ICD-10-CM | POA: Diagnosis not present

## 2016-10-09 DIAGNOSIS — N400 Enlarged prostate without lower urinary tract symptoms: Secondary | ICD-10-CM | POA: Diagnosis not present

## 2016-10-09 DIAGNOSIS — R3915 Urgency of urination: Secondary | ICD-10-CM | POA: Diagnosis not present

## 2016-10-09 DIAGNOSIS — N5201 Erectile dysfunction due to arterial insufficiency: Secondary | ICD-10-CM | POA: Diagnosis not present

## 2016-10-09 DIAGNOSIS — E291 Testicular hypofunction: Secondary | ICD-10-CM | POA: Diagnosis not present

## 2017-03-20 DIAGNOSIS — Z125 Encounter for screening for malignant neoplasm of prostate: Secondary | ICD-10-CM | POA: Diagnosis not present

## 2017-03-20 DIAGNOSIS — I10 Essential (primary) hypertension: Secondary | ICD-10-CM | POA: Diagnosis not present

## 2017-03-20 DIAGNOSIS — E784 Other hyperlipidemia: Secondary | ICD-10-CM | POA: Diagnosis not present

## 2017-03-27 DIAGNOSIS — L988 Other specified disorders of the skin and subcutaneous tissue: Secondary | ICD-10-CM | POA: Diagnosis not present

## 2017-03-27 DIAGNOSIS — E298 Other testicular dysfunction: Secondary | ICD-10-CM | POA: Diagnosis not present

## 2017-03-27 DIAGNOSIS — R5383 Other fatigue: Secondary | ICD-10-CM | POA: Diagnosis not present

## 2017-03-27 DIAGNOSIS — Z0000 Encounter for general adult medical examination without abnormal findings: Secondary | ICD-10-CM | POA: Diagnosis not present

## 2017-03-28 DIAGNOSIS — H401111 Primary open-angle glaucoma, right eye, mild stage: Secondary | ICD-10-CM | POA: Diagnosis not present

## 2017-03-30 DIAGNOSIS — Z85828 Personal history of other malignant neoplasm of skin: Secondary | ICD-10-CM | POA: Diagnosis not present

## 2017-03-30 DIAGNOSIS — L723 Sebaceous cyst: Secondary | ICD-10-CM | POA: Diagnosis not present

## 2017-04-06 DIAGNOSIS — Z1212 Encounter for screening for malignant neoplasm of rectum: Secondary | ICD-10-CM | POA: Diagnosis not present

## 2017-04-10 DIAGNOSIS — N400 Enlarged prostate without lower urinary tract symptoms: Secondary | ICD-10-CM | POA: Diagnosis not present

## 2017-04-10 DIAGNOSIS — E291 Testicular hypofunction: Secondary | ICD-10-CM | POA: Diagnosis not present

## 2017-04-17 DIAGNOSIS — Z125 Encounter for screening for malignant neoplasm of prostate: Secondary | ICD-10-CM | POA: Diagnosis not present

## 2017-04-17 DIAGNOSIS — N5201 Erectile dysfunction due to arterial insufficiency: Secondary | ICD-10-CM | POA: Diagnosis not present

## 2017-04-17 DIAGNOSIS — E291 Testicular hypofunction: Secondary | ICD-10-CM | POA: Diagnosis not present

## 2017-04-20 ENCOUNTER — Encounter: Payer: Self-pay | Admitting: *Deleted

## 2017-04-22 ENCOUNTER — Encounter: Payer: Self-pay | Admitting: Internal Medicine

## 2017-04-24 ENCOUNTER — Ambulatory Visit (INDEPENDENT_AMBULATORY_CARE_PROVIDER_SITE_OTHER): Payer: Medicare Other | Admitting: Internal Medicine

## 2017-04-24 ENCOUNTER — Encounter: Payer: Self-pay | Admitting: Internal Medicine

## 2017-04-24 VITALS — BP 118/82 | HR 71 | Resp 18 | Ht 74.0 in | Wt 262.8 lb

## 2017-04-24 DIAGNOSIS — G4733 Obstructive sleep apnea (adult) (pediatric): Secondary | ICD-10-CM | POA: Diagnosis not present

## 2017-04-24 NOTE — Progress Notes (Signed)
  Subjective:    Patient ID: Tyler Bradley, male    DOB: 07-Nov-1951, 66 y.o.   MRN: [id number]  HPI    male followed for OSA, complicated by CAD/ MI, obesity, history of parotid neoplasm, HBP NPSG- 06/02/1999- AHI 41/ hr  -----------------------------------------------------------------------------------  04/24/2016-66 year old male followed for OSA, complicated by CAD/ MI, HBP, obesity, history of parotid neoplasm NPSG- 06/02/1999- AHI 41/ hr CPAP 12/ [facility] Former [facility] patient; Will need new local DME-wears CPAP nightly. DL attached.   04/24/17- 66 year old male followed for OSA, complicated by CAD/ MI, HBP, obesity, history of parotid neoplasm CPAP 12/[facility] FOLLOW UP FOR 1 year follow up patient states that he uses his CPAP every night and sleeps all night long . patient states that he sleeps 6 to 7 hours at a time .  Download 100%/4 hour compliance, AHI 1.5/hour confirming good control. He definitely feels he benefits from CPAP.  Review of Systems  Constitutional: Positive for fever and unexpected weight change.  HENT: Positive for congestion, rhinorrhea, sneezing, postnasal drip and sinus pressure. Negative for ear pain, nosebleeds, sore throat, trouble swallowing and dental problem.   Eyes: Positive for redness and itching.  Respiratory: Negative for cough, chest tightness, shortness of breath and wheezing.   Cardiovascular: Positive for palpitations and leg swelling.  Gastrointestinal: Negative for nausea and vomiting.  Genitourinary: Negative for dysuria.  Musculoskeletal: Negative for joint swelling.  Skin: Positive for rash.  Neurological: Negative for headaches.  Hematological: Bruises/bleeds easily.  Psychiatric/Behavioral: Negative for dysphoric mood. The patient is not nervous/anxious.     Objective:  OBJ- Physical Exam General- Alert, Oriented, Affect-appropriate, Distress- none acute Skin- rash-none, lesions- none, excoriation- none Lymphadenopathy-  none Head- atraumatic            Eyes- Gross vision intact, PERRLA, conjunctivae and secretions clear            Ears- Hearing, canals-normal            Nose- Clear, no-Septal dev, mucus, polyps, erosion, perforation             Throat- Mallampati III , mucosa clear , drainage- none, tonsils- atrophic Neck- flexible , trachea midline, no stridor , thyroid nl, carotid no bruit Chest - symmetrical excursion , unlabored           Heart/CV- RRR , no murmur , no gallop  , no rub, nl s1 s2                           - JVD- none , edema- none, stasis changes- none, varices- none           Lung- clear to P&A, wheeze- none, cough- none , dullness-none, rub- none           Chest wall-  Abd-  Br/ Gen/ Rectal- Not done, not indicated Extrem- cyanosis- none, clubbing, none, atrophy- none, strength- nl Neuro- grossly intact to observation         Assessment & Plan:

## 2017-04-24 NOTE — Patient Instructions (Signed)
Order- DME [facility]- continue CPAP 12, mask of choice, humidifier, supplies, AirView   Dx OSA  Please call as needed

## 2017-04-30 NOTE — Assessment & Plan Note (Signed)
Weight today recorded 262 pounds. Importance of weight loss and safe diet emphasized for management of his several medical problems.

## 2017-04-30 NOTE — Assessment & Plan Note (Signed)
Download confirms he is doing quite well with CPAP 12. No concerns identified. Comfort issues and replacement of supplies discussed.

## 2017-05-04 DIAGNOSIS — G4733 Obstructive sleep apnea (adult) (pediatric): Secondary | ICD-10-CM | POA: Diagnosis not present

## 2017-05-11 ENCOUNTER — Encounter (INDEPENDENT_AMBULATORY_CARE_PROVIDER_SITE_OTHER): Payer: Self-pay

## 2017-05-11 ENCOUNTER — Ambulatory Visit (INDEPENDENT_AMBULATORY_CARE_PROVIDER_SITE_OTHER): Payer: Medicare Other | Admitting: Cardiovascular Disease

## 2017-05-11 ENCOUNTER — Encounter: Payer: Self-pay | Admitting: Cardiovascular Disease

## 2017-05-11 VITALS — BP 110/70 | HR 69 | Ht 74.0 in | Wt 264.0 lb

## 2017-05-11 DIAGNOSIS — I2510 Atherosclerotic heart disease of native coronary artery without angina pectoris: Secondary | ICD-10-CM | POA: Diagnosis not present

## 2017-05-11 MED ORDER — ASPIRIN 81 MG PO TABS: 81.0000 mg | ORAL_TABLET | Freq: Every day | ORAL | Status: AC

## 2017-05-11 NOTE — Progress Notes (Signed)
Cardiology Office Note Date:  05/11/2017   ID:  OLAWALE MARNEY, DOB [birth year], MRN [id number]  PCP:  Leanna Battles, MD  Cardiologist:  Sherren Mocha, MD    Chief Complaint  Patient presents with  . Coronary Artery Disease    History of Present Illness: BREN STEERS is a 66 y.o. male who presents for follow-up of CAD. He presented in 2007 with acute coronary syndrome and underwent cardiac catheterization in [place], [place]. He underwent PCI with overlapping [employer] bare-metal stents in the right coronary artery. His symptoms at the time of presentation included diaphoresis, pallor, and right shoulder/upper back discomfort. This has not recurred since the time of his PCI procedure.   The patient is here alone today. He is doing well except for a feeling of generalized fatigue. He is exercising on a regular basis, walking 1/2-2 miles most days. He has no exertional symptoms. He specifically denies chest pain, shortness of breath, edema, lightheadedness, orthopnea, PND, or syncope. Occasionally he wakes up with a feeling of a rapid heartbeat but this resolves quickly. No palpitations otherwise.  Past Medical History:  Diagnosis Date  . Arthritis    OA   . CAD (coronary artery disease)   . Cancer (HCC)    BASAL CELL CARCINOMA  . Fatigue   . Heart attack ([place])    2007 DR. Burt Knack   . Hepatitis   . History of rheumatic fever   . HTN (hypertension)   . Hyperlipemia   . OSA (obstructive sleep apnea)    CPAP   . Stroke [facility])    04/2012    Past Surgical History:  Procedure Laterality Date  . BASAL CELL CARCINOMA EXCISION     from forehead  . CORONARY ANGIOPLASTY WITH STENT PLACEMENT  2007  . PAROTIDECTOMY Left 09/04/2014   Procedure: PAROTIDECTOMY;  Surgeon: Melida Quitter, MD;  Location: [facility];  Service: ENT;  Laterality: Left;  . SEPTOPLASTY  1984  . TOTAL HIP ARTHROPLASTY Right 11/2011  . TOTAL HIP ARTHROPLASTY Left 04/2012  . UMBILICAL  HERNIA REPAIR  1998    Current Outpatient Prescriptions  Medication Sig Dispense Refill  . ibuprofen (ADVIL,MOTRIN) 200 MG tablet Take 200 mg by mouth every 6 (six) hours as needed for mild pain.    Marland Kitchen aspirin 81 MG tablet Take 1 tablet (81 mg total) by mouth daily.    . Cholecalciferol (VITAMIN D3) 5000 UNITS CAPS Take 5,000 Units by mouth daily.    . Coenzyme Q10 300 MG CAPS Take 300 mg by mouth 2 (two) times daily.     Marland Kitchen latanoprost (XALATAN) 0.005 % ophthalmic solution Place 1 drop into both eyes at bedtime.    . Omega-3 Fatty Acids (FISH OIL) 1000 MG CAPS Take 1 capsule by mouth daily.    . OMEGA-3 KRILL OIL PO Take 1 capsule by mouth daily.     . rosuvastatin (CRESTOR) 10 MG tablet TAKE 1 TABLET BY MOUTH 3 TIMES PER WEEK 36 tablet 3  . vitamin E 400 UNIT capsule Take 400 Units by mouth 2 (two) times daily.     No current facility-administered medications for this visit.     Allergies:   Latex; Sulfa antibiotics; Sulfonamide derivatives; and Tape   Social History:  The patient  reports that he has never smoked. He has never used smokeless tobacco. He reports that he drinks alcohol. He reports that he does not use drugs.   Family History:  The patient's family history includes Breast  cancer in his mother; Diabetes in his mother; Hodgkin's lymphoma in his father; Other in his paternal grandmother.    ROS:  Please see the history of present illness.  Otherwise, review of systems is positive for blood in stool, back pain, rash, bruising, skipped heart beats, balance problems.  All other systems are reviewed and negative.    PHYSICAL EXAM: VS:  BP 110/70   Pulse 69   Ht 6\' 2"  (1.88 m)   Wt 264 lb (119.7 kg)   SpO2 95%   BMI 33.90 kg/m  , BMI Body mass index is 33.9 kg/m. GEN: Well nourished, well developed, in no acute distress  HEENT: normal  Neck: no JVD, no masses. No carotid bruits Cardiac: RRR without murmur or gallop                Respiratory:  clear to auscultation  bilaterally, normal work of breathing GI: soft, nontender, nondistended, + BS MS: no deformity or atrophy  Ext: no pretibial edema, pedal pulses 2+= bilaterally Skin: warm and dry, no rash Neuro:  Strength and sensation are intact Psych: euthymic mood, full affect  EKG:  EKG is ordered today. The ekg ordered today shows NSR 60 bpm, moderate voltage criteria for LVH, no significant ST-T changes  Recent Labs: No results found for requested labs within last 8760 hours.   Lipid Panel     Component Value Date/Time   CHOL 113 03/16/2015 0919   TRIG 84.0 03/16/2015 0919   HDL 40.60 03/16/2015 0919   CHOLHDL 3 03/16/2015 0919   VLDL 16.8 03/16/2015 0919   LDLCALC 56 03/16/2015 0919      Wt Readings from Last 3 Encounters:  05/11/17 264 lb (119.7 kg)  04/24/17 262 lb 12.8 oz (119.2 kg)  05/05/16 264 lb 12.8 oz (120.1 kg)     Cardiac Studies Reviewed: [facility] 05-11-2016: Study Highlights    There was no ST segment deviation noted during stress.  Normal exercise tolerance test at an adequate heart rate, blood pressure and workload achieved.  This is a low risk study.   ASSESSMENT AND PLAN: 1.  CAD, native vessel: no angina. The patient is doing well on his current medical regimen. However, he complains of progressive fatigue. He would like a trial off of his beta blocker. Will stop Toprol-XL which he takes a very low dose. Will observe to see if his symptoms improved.  2. Essential HTN: Blood pressure is well controlled on a low-dose of Toprol-XL. This will be discontinued to see if his fatigue improves.  3. Hyperlipidemia: He takes Crestor 10 mg 3 days per week. Lipids followed by his PCP. We discussed weight loss strategies. He is doing well with his diet. He will try to increase his exercise to add an additional 20 minutes of exercise every day either with an additional mile walk or with adding isometric exercise.  Current medicines are reviewed with the patient today.  The  patient does not have concerns regarding medicines.  Labs/ tests ordered today include:   Orders Placed This Encounter  Procedures  . EKG 12-Lead    Disposition:   FU one year  Signed, Sherren Mocha, MD  05/11/2017 11:28 AM    [facility] [place], [place], [place]  [address] Phone: [phone]; Fax: [phone]

## 2017-05-11 NOTE — Patient Instructions (Addendum)
Medication Instructions:  Your physician has recommended you make the following change in your medication:  1. STOP Metoprolol Succinate 2. DECREASE Aspirin to 81mg  take one tablet by mouth daily  Labwork: No new orders.   Testing/Procedures: No new orders.   Follow-Up: Your physician wants you to follow-up in: 1 YEAR with Dr Burt Knack. You will receive a reminder letter in the mail two months in advance. If you don't receive a letter, please call our office to schedule the follow-up appointment.   Any Other Special Instructions Will Be Listed Below (If Applicable).     If you need a refill on your cardiac medications before your next appointment, please call your pharmacy.

## 2017-05-25 DIAGNOSIS — R195 Other fecal abnormalities: Secondary | ICD-10-CM | POA: Diagnosis not present

## 2017-05-25 DIAGNOSIS — Z86010 Personal history of colonic polyps: Secondary | ICD-10-CM | POA: Diagnosis not present

## 2017-06-23 ENCOUNTER — Other Ambulatory Visit: Payer: Self-pay | Admitting: Cardiovascular Disease

## 2017-09-27 DIAGNOSIS — H401111 Primary open-angle glaucoma, right eye, mild stage: Secondary | ICD-10-CM | POA: Diagnosis not present

## 2017-09-27 DIAGNOSIS — H25012 Cortical age-related cataract, left eye: Secondary | ICD-10-CM | POA: Diagnosis not present

## 2017-09-27 DIAGNOSIS — H2513 Age-related nuclear cataract, bilateral: Secondary | ICD-10-CM | POA: Diagnosis not present

## 2018-02-06 DIAGNOSIS — Z85828 Personal history of other malignant neoplasm of skin: Secondary | ICD-10-CM | POA: Diagnosis not present

## 2018-02-06 DIAGNOSIS — L720 Epidermal cyst: Secondary | ICD-10-CM | POA: Diagnosis not present

## 2018-02-20 DIAGNOSIS — G609 Hereditary and idiopathic neuropathy, unspecified: Secondary | ICD-10-CM | POA: Diagnosis not present

## 2018-02-20 DIAGNOSIS — M5416 Radiculopathy, lumbar region: Secondary | ICD-10-CM | POA: Diagnosis not present

## 2018-03-26 DIAGNOSIS — Z125 Encounter for screening for malignant neoplasm of prostate: Secondary | ICD-10-CM | POA: Diagnosis not present

## 2018-03-26 DIAGNOSIS — E7849 Other hyperlipidemia: Secondary | ICD-10-CM | POA: Diagnosis not present

## 2018-03-26 DIAGNOSIS — R82998 Other abnormal findings in urine: Secondary | ICD-10-CM | POA: Diagnosis not present

## 2018-03-26 DIAGNOSIS — I10 Essential (primary) hypertension: Secondary | ICD-10-CM | POA: Diagnosis not present

## 2018-03-28 DIAGNOSIS — H401111 Primary open-angle glaucoma, right eye, mild stage: Secondary | ICD-10-CM | POA: Diagnosis not present

## 2018-04-02 DIAGNOSIS — I10 Essential (primary) hypertension: Secondary | ICD-10-CM | POA: Diagnosis not present

## 2018-04-02 DIAGNOSIS — I2510 Atherosclerotic heart disease of native coronary artery without angina pectoris: Secondary | ICD-10-CM | POA: Diagnosis not present

## 2018-04-02 DIAGNOSIS — E668 Other obesity: Secondary | ICD-10-CM | POA: Diagnosis not present

## 2018-04-02 DIAGNOSIS — Z0000 Encounter for general adult medical examination without abnormal findings: Secondary | ICD-10-CM | POA: Diagnosis not present

## 2018-04-05 DIAGNOSIS — Z1212 Encounter for screening for malignant neoplasm of rectum: Secondary | ICD-10-CM | POA: Diagnosis not present

## 2018-04-24 ENCOUNTER — Ambulatory Visit: Payer: Medicare Other | Admitting: Internal Medicine

## 2018-05-07 DIAGNOSIS — R270 Ataxia, unspecified: Secondary | ICD-10-CM | POA: Diagnosis not present

## 2018-05-07 DIAGNOSIS — G609 Hereditary and idiopathic neuropathy, unspecified: Secondary | ICD-10-CM | POA: Diagnosis not present

## 2018-05-07 DIAGNOSIS — M5416 Radiculopathy, lumbar region: Secondary | ICD-10-CM | POA: Diagnosis not present

## 2018-08-07 ENCOUNTER — Ambulatory Visit: Payer: Medicare Other | Admitting: Cardiovascular Disease

## 2018-08-07 ENCOUNTER — Encounter: Payer: Self-pay | Admitting: Cardiovascular Disease

## 2018-08-07 VITALS — BP 138/80 | HR 71 | Ht 74.0 in | Wt 269.4 lb

## 2018-08-07 DIAGNOSIS — I2510 Atherosclerotic heart disease of native coronary artery without angina pectoris: Secondary | ICD-10-CM | POA: Diagnosis not present

## 2018-08-07 NOTE — Patient Instructions (Signed)
Medication Instructions:  Your provider recommends that you continue on your current medications as directed. Please refer to the Current Medication list given to you today.    Labwork: None  Testing/Procedures: Dr. Cooper recommends you have a NUCLEAR STRESS TEST.  Follow-Up: Your provider wants you to follow-up in: 1 year with Dr. Cooper. You will receive a reminder letter in the mail two months in advance. If you don't receive a letter, please call our office to schedule the follow-up appointment.    Any Other Special Instructions Will Be Listed Below (If Applicable).     If you need a refill on your cardiac medications before your next appointment, please call your pharmacy.     

## 2018-08-07 NOTE — Progress Notes (Signed)
Cardiology Office Note Date:  08/11/2018   ID:  Tyler Bradley, DOB 02-22-1951, MRN [id number]  PCP:  Tyler Bradley  Cardiologist:  Tyler Bradley    Chief Complaint  Patient presents with  . Coronary Artery Disease     History of Present Illness: Tyler Bradley is a 67 y.o. male who presents for follow-up evaluation.  The patient has been followed for coronary artery disease after initially presenting with acute coronary syndrome in 2007.  He underwent stenting of the RCA with overlapping bare-metal stents.  He has had no recurrent ischemic symptoms since the time of his initial procedure.  He has complained of exertional dyspnea in the past.  In 2017 he underwent an exercise treadmill stress test demonstrating no ST segment deviation during stress with normal exercise tolerance.  The patient is here alone today.  His complaints include mid scapular discomfort that reminds him of the symptoms he had before his stenting procedure.  However, the discomfort is not associated with physical exertion.  He is able to walk 1-1/2 to 2 miles without symptoms of chest or back pain.  He denies shortness of breath.  He does complain of generalized fatigue.  He also complains of leg swelling which is been a chronic problem.  Swelling improves with leg elevation.  He is worn compression socks in the past but is not consistent with this.  He denies orthopnea, PND, or shortness of breath with activity.  He denies heart palpitations.  He has not required nitroglycerin.   Past Medical History:  Diagnosis Date  . Arthritis    OA   . CAD (coronary artery disease)   . Cancer (HCC)    BASAL CELL CARCINOMA  . Fatigue   . Heart attack ([facility])    2007 DR. Burt Knack   . Hepatitis   . History of rheumatic fever   . HTN (hypertension)   . Hyperlipemia   . OSA (obstructive sleep apnea)    CPAP   . Stroke [facility])    04/2012    Past Surgical History:  Procedure Laterality Date  . BASAL  CELL CARCINOMA EXCISION     from forehead  . CORONARY ANGIOPLASTY WITH STENT PLACEMENT  2007  . PAROTIDECTOMY Left 09/04/2014   Procedure: PAROTIDECTOMY;  Surgeon: Tyler Bradley;  Location: [facility];  Service: ENT;  Laterality: Left;  . SEPTOPLASTY  1984  . TOTAL HIP ARTHROPLASTY Right 11/2011  . TOTAL HIP ARTHROPLASTY Left 04/2012  . UMBILICAL HERNIA REPAIR  1998    Current Outpatient Medications  Medication Sig Dispense Refill  . aspirin 81 MG tablet Take 1 tablet (81 mg total) by mouth daily.    . Cholecalciferol (VITAMIN D3) 5000 UNITS CAPS Take 5,000 Units by mouth daily.    . Coenzyme Q10 300 MG CAPS Take 300 mg by mouth 2 (two) times daily.     Marland Kitchen ibuprofen (ADVIL,MOTRIN) 200 MG tablet Take 200 mg by mouth every 6 (six) hours as needed for mild pain.    Marland Kitchen L-Methylfolate-Algae-B12-B6 (METANX PO) Take by mouth 2 (two) times daily.    Marland Kitchen latanoprost (XALATAN) 0.005 % ophthalmic solution Place 1 drop into both eyes at bedtime.    . metoprolol succinate (TOPROL-XL) 25 MG 24 hr tablet Take 12.5 mg by mouth daily.    . Omega-3 Fatty Acids (FISH OIL) 1000 MG CAPS Take 1 capsule by mouth daily.    . OMEGA-3 KRILL OIL PO Take 1 capsule by mouth daily.     Marland Kitchen  rosuvastatin (CRESTOR) 10 MG tablet TAKE 1 TABLET BY MOUTH 3 TIMES PER WEEK 36 tablet 3  . vitamin E 400 UNIT capsule Take 400 Units by mouth 2 (two) times daily.     No current facility-administered medications for this visit.     Allergies:   Latex; Sulfa antibiotics; Sulfonamide derivatives; and Tape   Social History:  The patient  reports that he has never smoked. He has never used smokeless tobacco. He reports that he drinks alcohol. He reports that he does not use drugs.   Family History:  The patient's family history includes Breast cancer in his mother; Diabetes in his mother; Hodgkin's lymphoma in his father; Other in his paternal grandmother.    ROS:  Please see the history of present illness.  Otherwise, review of systems is  positive for balance problems, back pain, muscle pain, rash, dizziness, leg swelling.  All other systems are reviewed and negative.    PHYSICAL EXAM: VS:  BP 138/80   Pulse 71   Ht 6\' 2"  (1.88 m)   Wt 269 lb 6.4 oz (122.2 kg)   BMI 34.59 kg/m  , BMI Body mass index is 34.59 kg/m. GEN: Well nourished, well developed, in no acute distress  HEENT: normal  Neck: no JVD, no masses. No carotid bruits Cardiac: RRR without murmur or gallop                Respiratory:  clear to auscultation bilaterally, normal work of breathing GI: soft, nontender, nondistended, + BS MS: no deformity or atrophy  Ext: 1+ bilateral pretibial edema, pedal pulses 2+= bilaterally Skin: warm and dry, no rash Neuro:  Strength and sensation are intact Psych: euthymic mood, full affect  EKG:  EKG is ordered today. The ekg ordered today shows NSR 71 bpm, within normal limits  Recent Labs: No results found for requested labs within last 8760 hours.   Lipid Panel     Component Value Date/Time   CHOL 113 03/16/2015 0919   TRIG 84.0 03/16/2015 0919   HDL 40.60 03/16/2015 0919   CHOLHDL 3 03/16/2015 0919   VLDL 16.8 03/16/2015 0919   LDLCALC 56 03/16/2015 0919      Wt Readings from Last 3 Encounters:  08/07/18 269 lb 6.4 oz (122.2 kg)  05/11/17 264 lb (119.7 kg)  04/24/17 262 lb 12.8 oz (119.2 kg)     ASSESSMENT AND PLAN: 1.  Coronary artery disease, native vessel, without angina: The patient has typical and atypical symptoms, but is now 12 years out from his last PCI procedure.  His medical program is appropriate with a beta-blocker, statin drug, and antiplatelet therapy with aspirin.  I have recommended an exercise Myoview stress test for evaluation of myocardial ischemia.  2.  Hypertension: Blood pressure is well controlled.  We discussed lifestyle modification at length.  3.  Hyperlipidemia: Treated with rosuvastatin.  Lipids are followed by his primary physician.  Goal LDL cholesterol is less than 70  mg/dL.  4.  Obesity: His weights are trended and have continued to increase.  We discussed dietary modification and exercise today.  Current medicines are reviewed with the patient today.  The patient does not have concerns regarding medicines.  Labs/ tests ordered today include:   Orders Placed This Encounter  Procedures  . MYOCARDIAL PERFUSION IMAGING  . EKG 12-Lead    Disposition:   FU 1 year  Signed, Tyler Bradley  08/11/2018 2:22 PM    [facility] [address] [place], [place]  [address] Phone: [phone]; Fax: [phone]

## 2018-08-11 ENCOUNTER — Encounter: Payer: Self-pay | Admitting: Cardiovascular Disease

## 2018-08-19 ENCOUNTER — Telehealth (HOSPITAL_COMMUNITY): Payer: Self-pay | Admitting: *Deleted

## 2018-08-19 NOTE — Telephone Encounter (Signed)
Patient given detailed instructions per Myocardial Perfusion Study Information Sheet for the test on 08/21/18 at 0945. Patient notified to arrive 15 minutes early and that it is imperative to arrive on time for appointment to keep from having the test rescheduled.  If you need to cancel or reschedule your appointment, please call the office within 24 hours of your appointment. . Patient verbalized understanding.Jamicah Anstead, Ranae Palms

## 2018-08-21 ENCOUNTER — Ambulatory Visit (HOSPITAL_COMMUNITY): Payer: Medicare Other | Attending: Cardiology

## 2018-08-21 DIAGNOSIS — I2510 Atherosclerotic heart disease of native coronary artery without angina pectoris: Secondary | ICD-10-CM | POA: Insufficient documentation

## 2018-08-21 LAB — MYOCARDIAL PERFUSION IMAGING
CHL CUP MPHR: 152 {beats}/min
CHL CUP NUCLEAR SDS: 0
Estimated workload: 7 METS
Exercise duration (min): 6 min
Exercise duration (sec): 0 s
LV sys vol: 47 mL
LVDIAVOL: 111 mL (ref 62–150)
Peak HR: 136 {beats}/min
Percent HR: 89 %
Rest HR: 74 {beats}/min
SRS: 0
SSS: 0
TID: 0.99

## 2018-08-21 MED ORDER — TECHNETIUM TC 99M TETROFOSMIN IV KIT
10.6000 | PACK | Freq: Once | INTRAVENOUS | Status: AC | PRN
  Administered 2018-08-21: 10.6 via INTRAVENOUS
  Filled 2018-08-21: qty 11

## 2018-08-21 MED ORDER — TECHNETIUM TC 99M TETROFOSMIN IV KIT
32.5000 | PACK | Freq: Once | INTRAVENOUS | Status: AC | PRN
  Administered 2018-08-21: 32.5 via INTRAVENOUS
  Filled 2018-08-21: qty 33

## 2018-09-09 DIAGNOSIS — R270 Ataxia, unspecified: Secondary | ICD-10-CM | POA: Diagnosis not present

## 2018-09-09 DIAGNOSIS — G609 Hereditary and idiopathic neuropathy, unspecified: Secondary | ICD-10-CM | POA: Diagnosis not present

## 2018-09-09 DIAGNOSIS — M5416 Radiculopathy, lumbar region: Secondary | ICD-10-CM | POA: Diagnosis not present

## 2018-10-03 DIAGNOSIS — H401111 Primary open-angle glaucoma, right eye, mild stage: Secondary | ICD-10-CM | POA: Diagnosis not present

## 2018-10-03 DIAGNOSIS — H2513 Age-related nuclear cataract, bilateral: Secondary | ICD-10-CM | POA: Diagnosis not present

## 2018-10-03 DIAGNOSIS — H35372 Puckering of macula, left eye: Secondary | ICD-10-CM | POA: Diagnosis not present

## 2019-02-03 DIAGNOSIS — G609 Hereditary and idiopathic neuropathy, unspecified: Secondary | ICD-10-CM | POA: Diagnosis not present

## 2019-02-03 DIAGNOSIS — M5416 Radiculopathy, lumbar region: Secondary | ICD-10-CM | POA: Diagnosis not present

## 2019-02-03 DIAGNOSIS — R270 Ataxia, unspecified: Secondary | ICD-10-CM | POA: Diagnosis not present

## 2019-02-04 ENCOUNTER — Other Ambulatory Visit: Payer: Self-pay | Admitting: General Surgery

## 2019-02-04 ENCOUNTER — Telehealth: Payer: Self-pay | Admitting: Internal Medicine

## 2019-02-04 DIAGNOSIS — G4733 Obstructive sleep apnea (adult) (pediatric): Secondary | ICD-10-CM

## 2019-02-04 NOTE — Progress Notes (Unsigned)
Order for CPAP machine placed. Will print, contact patient to pick up script from office.

## 2019-02-04 NOTE — Telephone Encounter (Signed)
Called patient to let him know the printed prescription was approved by Dr. Annamaria Boots. Patient will come to the clinic to pick it up. Nothing further needed at this time.

## 2019-02-04 NOTE — Telephone Encounter (Signed)
RX printed and given to [facility] for completion.

## 2019-02-04 NOTE — Telephone Encounter (Signed)
Order for CPAP machine entered.

## 2019-02-04 NOTE — Telephone Encounter (Signed)
Ok to print a script for CPAP machine of choice, auto 5-20, mask of choice, tubing, filters and supplies   Dx OSA

## 2019-02-04 NOTE — Telephone Encounter (Signed)
Called the patient and he stated he did not call for a refill of prescriptions. He called because he needs a prescription for a travel cpap machine. If he can get a prescription he can take it with him and if he needs it when out of the country he can fill it. I advised him that insurance may not cover the machine if purchased out of country.  Patient stated he has been in contact with a company in [place] who stated they can overnight a cpap air mini to him. And if he has to pay out of pocket for the machine, he will.  Patient stated that he knows he waited until the last minute for this. I told him that a message would be sent to Dr. Annamaria Boots to advise if this would be approved or not since he has not been seen since 04/24/17.Message routed to Dr. Annamaria Boots for review and response.  The patient has not been seen since 04/24/17. Did check airview and the patient has been 100% compliant with his CPAP machine as of 02/04/19.   Dr. Annamaria Boots, can you take a look at the patient request and advise if a prescription can be issued? He has not seen you since May 2018. Thank you much.

## 2019-02-04 NOTE — Telephone Encounter (Signed)
Pt came in today and states that he is going out of the country on Tuesday 02/11/19 and will need RX's of all meds in case of emergency. States that he will be getting a travel CPAP machine and he will need a RX so they may ship machine to him.   Best callback: [phone]

## 2019-04-25 DIAGNOSIS — I10 Essential (primary) hypertension: Secondary | ICD-10-CM | POA: Diagnosis not present

## 2019-04-25 DIAGNOSIS — E7849 Other hyperlipidemia: Secondary | ICD-10-CM | POA: Diagnosis not present

## 2019-04-25 DIAGNOSIS — Z125 Encounter for screening for malignant neoplasm of prostate: Secondary | ICD-10-CM | POA: Diagnosis not present

## 2019-05-01 DIAGNOSIS — I10 Essential (primary) hypertension: Secondary | ICD-10-CM | POA: Diagnosis not present

## 2019-05-01 DIAGNOSIS — H401111 Primary open-angle glaucoma, right eye, mild stage: Secondary | ICD-10-CM | POA: Diagnosis not present

## 2019-05-01 DIAGNOSIS — R82998 Other abnormal findings in urine: Secondary | ICD-10-CM | POA: Diagnosis not present

## 2019-07-24 ENCOUNTER — Other Ambulatory Visit: Payer: Self-pay

## 2019-07-24 ENCOUNTER — Ambulatory Visit (INDEPENDENT_AMBULATORY_CARE_PROVIDER_SITE_OTHER): Payer: Medicare Other | Admitting: Physician Assistant

## 2019-07-24 ENCOUNTER — Encounter: Payer: Self-pay | Admitting: Physician Assistant

## 2019-07-24 VITALS — BP 134/82 | HR 70 | Ht 74.0 in | Wt 269.8 lb

## 2019-07-24 DIAGNOSIS — E785 Hyperlipidemia, unspecified: Secondary | ICD-10-CM

## 2019-07-24 DIAGNOSIS — R42 Dizziness and giddiness: Secondary | ICD-10-CM | POA: Diagnosis not present

## 2019-07-24 DIAGNOSIS — R600 Localized edema: Secondary | ICD-10-CM | POA: Diagnosis not present

## 2019-07-24 DIAGNOSIS — I10 Essential (primary) hypertension: Secondary | ICD-10-CM | POA: Diagnosis not present

## 2019-07-24 DIAGNOSIS — I2510 Atherosclerotic heart disease of native coronary artery without angina pectoris: Secondary | ICD-10-CM

## 2019-07-24 MED ORDER — POTASSIUM CHLORIDE ER 10 MEQ PO TBCR: EXTENDED_RELEASE_TABLET | ORAL | 3 refills | Status: DC

## 2019-07-24 MED ORDER — FUROSEMIDE 20 MG PO TABS: ORAL_TABLET | ORAL | 3 refills | Status: DC

## 2019-07-24 NOTE — Patient Instructions (Signed)
Medication Instructions:  Your physician has recommended you make the following change in your medication:  1.  START Lasix 20 mg taking 1 tablet daily for 2 days then only as needed for sweling 2.  START Potassium 10 meq taking 1 tablet by mouth for 2 days then only as needed when you have to use the Lasix   If you need a refill on your cardiac medications before your next appointment, please call your pharmacy.   Lab work: None ordered  If you have labs (blood work) drawn today and your tests are completely normal, you will receive your results only by: Marland Kitchen MyChart Message (if you have MyChart) OR . A paper copy in the mail If you have any lab test that is abnormal or we need to change your treatment, we will call you to review the results.  Testing/Procedures: None ordered  Follow-Up: At [facility], you and your health needs are our priority.  As part of our continuing mission to provide you with exceptional heart care, we have created designated Provider Care Teams.  These Care Teams include your primary Cardiologist (physician) and Advanced Practice Providers (APPs -  Physician Assistants and Nurse Practitioners) who all work together to provide you with the care you need, when you need it. . You will need a follow up appointment in:  You are already scheduled to see Dr. Burt Knack 12/29, we want you to keep that appointment.   Any Other Special Instructions Will Be Listed Below (If Applicable).

## 2019-07-24 NOTE — Progress Notes (Signed)
Cardiology Office Note    Date:  07/24/2019   ID:  Tyler Bradley, DOB 1951/02/08, MRN [id number]  PCP:  Leanna Battles, MD  Cardiologist: Dr. Burt Knack  Chief Complaint:dizziness   History of Present Illness:   Tyler Bradley is a 68 y.o. male CAD status post overlapping bare-metal stent to RCA, hypertension, hyperlipidemia, obstructive sleep apnea on CPAP and stroke added to schedule for evaluation of shortness of breath and dizziness.  History of STEMI in 2007.  He underwent overlapping bare-metal stent to RCA.  Last seen by Dr. Burt Knack September 2019.  He had chest pain.  Follow-up exercise Myoview was low risk study without evidence of ischemia.  Presents today for evaluation of dizziness and yearly follow-up.  Patient has intermittent dizziness when trying to stand from sitting position.  Takes a deep breath and dizziness goes away.  No associated chest pain or palpitation.  He is walking 1 to 2 miles multiple times per week without any issue.  Intermittent lower extremity edema.  Denies orthopnea, PND, syncope or melena.  Compliant with low-sodium diet and CPAP.  Past Medical History:  Diagnosis Date  . Arthritis    OA   . CAD (coronary artery disease)   . Cancer (HCC)    BASAL CELL CARCINOMA  . Fatigue   . Heart attack ([place])    2007 DR. Burt Knack   . Hepatitis   . History of rheumatic fever   . HTN (hypertension)   . Hyperlipemia   . OSA (obstructive sleep apnea)    CPAP   . Stroke [facility])    04/2012    Past Surgical History:  Procedure Laterality Date  . BASAL CELL CARCINOMA EXCISION     from forehead  . CORONARY ANGIOPLASTY WITH STENT PLACEMENT  2007  . PAROTIDECTOMY Left 09/04/2014   Procedure: PAROTIDECTOMY;  Surgeon: Melida Quitter, MD;  Location: [place];  Service: ENT;  Laterality: Left;  . SEPTOPLASTY  1984  . TOTAL HIP ARTHROPLASTY Right 11/2011  . TOTAL HIP ARTHROPLASTY Left 04/2012  . UMBILICAL HERNIA REPAIR  1998    Current Medications: Prior  to Admission medications   Medication Sig Start Date End Date Taking? Authorizing Provider  aspirin 81 MG tablet Take 1 tablet (81 mg total) by mouth daily. 05/11/17   Sherren Mocha, MD  Cholecalciferol (VITAMIN D3) 5000 UNITS CAPS Take 5,000 Units by mouth daily.    [provider]  Coenzyme Q10 300 MG CAPS Take 300 mg by mouth 2 (two) times daily.     [provider]  ibuprofen (ADVIL,MOTRIN) 200 MG tablet Take 200 mg by mouth every 6 (six) hours as needed for mild pain.    [provider]  L-Methylfolate-Algae-B12-B6 (METANX PO) Take by mouth 2 (two) times daily.    [provider]  latanoprost (XALATAN) 0.005 % ophthalmic solution Place 1 drop into both eyes at bedtime.    [provider]  metoprolol succinate (TOPROL-XL) 25 MG 24 hr tablet Take 12.5 mg by mouth daily.    [provider]  Omega-3 Fatty Acids (FISH OIL) 1000 MG CAPS Take 1 capsule by mouth daily.    [provider]  OMEGA-3 KRILL OIL PO Take 1 capsule by mouth daily.     [provider]  rosuvastatin (CRESTOR) 10 MG tablet TAKE 1 TABLET BY MOUTH 3 TIMES PER WEEK 06/25/17   Sherren Mocha, MD  vitamin E 400 UNIT capsule Take 400 Units by mouth 2 (two) times daily.  [provider]    Allergies:   Latex, Sulfa antibiotics, Sulfonamide derivatives, and Tape   Social History   Socioeconomic History  . Marital status: Married    Spouse name: Not on file  . Number of children: 2  . Years of education: Not on file  . Highest education level: Not on file  Occupational History    Comment: Benefits consultant  Social Needs  . Financial resource strain: Not on file  . Food insecurity    Worry: Not on file    Inability: Not on file  . Transportation needs    Medical: Not on file    Non-medical: Not on file  Tobacco Use  . Smoking status: Never Smoker  . Smokeless tobacco: Never Used  Substance and Sexual Activity  . Alcohol use: Yes  .  Drug use: No  . Sexual activity: Not on file  Lifestyle  . Physical activity    Days per week: Not on file    Minutes per session: Not on file  . Stress: Not on file  Relationships  . Social Herbalist on phone: Not on file    Gets together: Not on file    Attends religious service: Not on file    Active member of club or organization: Not on file    Attends meetings of clubs or organizations: Not on file    Relationship status: Not on file  Other Topics Concern  . Not on file  Social History Narrative  . Not on file     Family History:  The patient's family history includes Breast cancer in his mother; Diabetes in his mother; Hodgkin's lymphoma in his father; Other in his paternal grandmother.   ROS:   Please see the history of present illness.    ROS All other systems reviewed and are negative.   PHYSICAL EXAM:   VS:  BP 134/82   Pulse 70   Ht 6\' 2"  (1.88 m)   Wt 269 lb 12.8 oz (122.4 kg)   SpO2 94%   BMI 34.64 kg/m    GEN: Well nourished, well developed, in no acute distress  HEENT: normal  Neck: no JVD, carotid bruits, or masses Cardiac: RRR; no murmurs, rubs, or gallops, trace to 1+ bilateral lower extremity edema  Respiratory:  clear to auscultation bilaterally, normal work of breathing GI: soft, nontender, nondistended, + BS MS: no deformity or atrophy  Skin: warm and dry, no rash Neuro:  Alert and Oriented x 3, Strength and sensation are intact Psych: euthymic mood, full affect  Wt Readings from Last 3 Encounters:  07/24/19 269 lb 12.8 oz (122.4 kg)  08/21/18 269 lb (122 kg)  08/07/18 269 lb 6.4 oz (122.2 kg)      Studies/Labs Reviewed:   EKG:  EKG is ordered today.  The ekg ordered today demonstrates normal sinus rhythm at rate of 70 bpm  Recent Labs: No results found for requested labs within last 8760 hours.   Lipid Panel    Component Value Date/Time   CHOL 113 03/16/2015 0919   TRIG 84.0 03/16/2015 0919   HDL 40.60 03/16/2015  0919   CHOLHDL 3 03/16/2015 0919   VLDL 16.8 03/16/2015 0919   LDLCALC 56 03/16/2015 0919    Additional studies/ records that were reviewed today include:   Stress test 08/2018  Nuclear stress EF: 58%.  Blood pressure demonstrated a normal response to exercise.  There was no ST segment deviation noted during stress.  This is a low risk study.  The left ventricular ejection fraction is normal (55-65%).   1. No evidence for ischemia from exercise ECG.  2. EF 58%, normal wall motion.  3. Fixed small, mild basal inferior perfusion defect.   With normal wall motion, suspect diaphragmatic artifact.  No ischemia.     ASSESSMENT & PLAN:    1. CAD No angina.  Continue exercise regimen.  Continue aspirin, statin and beta-blocker.  2. HTN - BP stable on current medications.   3. HLD -LDL 62 04/2019 -Continue statin.  4.  Dizziness -Only occurs occasionally while trying to stand from sitting position.  He is not orthostatic by vitals today.  No neurological symptoms.  Patient has neuropathy.  Advised to follow-up with PCP for further evaluation.  5.  Lower extremity edema -Without shortness of breath, orthopnea or PND.  Reports low-sodium diet. -I have advised to cut back on salt further.  PRN Lasix.  If no improvement will consider echocardiogram.   Medication Adjustments/Labs and Tests Ordered: Current medicines are reviewed at length with the patient today.  Concerns regarding medicines are outlined above.  Medication changes, Labs and Tests ordered today are listed in the Patient Instructions below. There are no Patient Instructions on file for this visit.   Jarrett Soho, [place]  07/24/2019 4:33 PM    [facility] [place], [place], [place]  [address] Phone: [phone]; Fax: [phone]

## 2019-07-28 ENCOUNTER — Telehealth: Payer: Self-pay | Admitting: Cardiovascular Disease

## 2019-07-28 NOTE — Telephone Encounter (Signed)
Called pt to clarify how pt is supposed to be taking his medications lasix and potassium. Pharmacist tech verbalized understanding.

## 2019-07-28 NOTE — Telephone Encounter (Signed)
New message    Pharmacy calling to confirm instructions   Pt c/o medication issue:  1. Name of Medication: Lasix and potassium chloride   2. How are you currently taking this medication (dosage and times per day)? n/a  3. Are you having a reaction (difficulty breathing--STAT)? n/a  4. What is your medication issue? Pharmacy calling to clarify instructions. Patient waiting

## 2019-08-20 DIAGNOSIS — M5416 Radiculopathy, lumbar region: Secondary | ICD-10-CM | POA: Diagnosis not present

## 2019-08-20 DIAGNOSIS — G609 Hereditary and idiopathic neuropathy, unspecified: Secondary | ICD-10-CM | POA: Diagnosis not present

## 2019-08-20 DIAGNOSIS — R270 Ataxia, unspecified: Secondary | ICD-10-CM | POA: Diagnosis not present

## 2019-08-20 DIAGNOSIS — G43709 Chronic migraine without aura, not intractable, without status migrainosus: Secondary | ICD-10-CM | POA: Diagnosis not present

## 2019-11-03 DIAGNOSIS — H2513 Age-related nuclear cataract, bilateral: Secondary | ICD-10-CM | POA: Diagnosis not present

## 2019-11-03 DIAGNOSIS — H401111 Primary open-angle glaucoma, right eye, mild stage: Secondary | ICD-10-CM | POA: Diagnosis not present

## 2019-11-03 DIAGNOSIS — H25012 Cortical age-related cataract, left eye: Secondary | ICD-10-CM | POA: Diagnosis not present

## 2019-11-06 DIAGNOSIS — L300 Nummular dermatitis: Secondary | ICD-10-CM | POA: Diagnosis not present

## 2019-11-06 DIAGNOSIS — L820 Inflamed seborrheic keratosis: Secondary | ICD-10-CM | POA: Diagnosis not present

## 2019-11-06 DIAGNOSIS — Z85828 Personal history of other malignant neoplasm of skin: Secondary | ICD-10-CM | POA: Diagnosis not present

## 2019-12-01 ENCOUNTER — Ambulatory Visit: Payer: Medicare Other | Admitting: Cardiovascular Disease

## 2019-12-01 ENCOUNTER — Encounter: Payer: Self-pay | Admitting: Cardiovascular Disease

## 2019-12-01 ENCOUNTER — Other Ambulatory Visit: Payer: Self-pay

## 2019-12-01 VITALS — BP 132/80 | HR 72 | Ht 74.0 in | Wt 271.0 lb

## 2019-12-01 DIAGNOSIS — I25119 Atherosclerotic heart disease of native coronary artery with unspecified angina pectoris: Secondary | ICD-10-CM | POA: Diagnosis not present

## 2019-12-01 DIAGNOSIS — I10 Essential (primary) hypertension: Secondary | ICD-10-CM | POA: Diagnosis not present

## 2019-12-01 DIAGNOSIS — E782 Mixed hyperlipidemia: Secondary | ICD-10-CM | POA: Diagnosis not present

## 2019-12-01 NOTE — Progress Notes (Signed)
Cardiology Office Note:    Date:  12/01/2019   ID:  Tyler Bradley, DOB [birth year], MRN [id number]  PCP:  Leanna Battles, MD  Cardiologist:  Sherren Mocha, MD  Electrophysiologist:  None   Referring MD: Leanna Battles, MD   Chief Complaint  Patient presents with  . Chest Pain  . Coronary Artery Disease    History of Present Illness:    Tyler Bradley is a 68 y.o. male with a hx of CAD, presenting for follow-up evaluation. The patient has been followed for coronary artery disease after initially presenting with acute coronary syndrome in 2007.  He underwent stenting of the RCA with overlapping bare-metal stents.  He has had no recurrent ischemic symptoms since the time of his initial procedure.  He has complained of exertional dyspnea in the past.  In 2017 he underwent an exercise treadmill stress test demonstrating no ST segment deviation during stress with normal exercise tolerance.  He is here alone today. Occasionally he feels like he has a 'bruise' in his chest, going from the front to the back. It is exacerbated by raising his arms. There is no relation to physical exertion. No shortness of breath, edema, or heart palpitations. He has back pain with walking. He generally walks about 1.5 miles. Notes that he's gained wait during the Covid 19 pandemic.   Past Medical History:  Diagnosis Date  . Arthritis    OA   . CAD (coronary artery disease)   . Cancer (HCC)    BASAL CELL CARCINOMA  . Fatigue   . Heart attack ([place])    2007 DR. Burt Knack   . Hepatitis   . History of rheumatic fever   . HTN (hypertension)   . Hyperlipemia   . OSA (obstructive sleep apnea)    CPAP   . Stroke [facility])    04/2012    Past Surgical History:  Procedure Laterality Date  . BASAL CELL CARCINOMA EXCISION     from forehead  . CORONARY ANGIOPLASTY WITH STENT PLACEMENT  2007  . PAROTIDECTOMY Left 09/04/2014   Procedure: PAROTIDECTOMY;  Surgeon: Melida Quitter, MD;  Location: [place];   Service: ENT;  Laterality: Left;  . SEPTOPLASTY  1984  . TOTAL HIP ARTHROPLASTY Right 11/2011  . TOTAL HIP ARTHROPLASTY Left 04/2012  . UMBILICAL HERNIA REPAIR  1998    Current Medications: Current Meds  Medication Sig  . l-methylfolate-B6-B12 (METANX) 3-35-2 MG TABS tablet Take 1 capsule by mouth daily.     Allergies:   Latex, Sulfa antibiotics, Sulfonamide derivatives, and Tape   Social History   Socioeconomic History  . Marital status: Married    Spouse name: Not on file  . Number of children: 2  . Years of education: Not on file  . Highest education level: Not on file  Occupational History    Comment: Benefits consultant  Tobacco Use  . Smoking status: Never Smoker  . Smokeless tobacco: Never Used  Substance and Sexual Activity  . Alcohol use: Yes  . Drug use: No  . Sexual activity: Not on file  Other Topics Concern  . Not on file  Social History Narrative  . Not on file   Social Determinants of Health   Financial Resource Strain:   . Difficulty of Paying Living Expenses: Not on file  Food Insecurity:   . Worried About Charity fundraiser in the Last Year: Not on file  . Ran Out of Food in the Last Year: Not on file  Transportation Needs:   . Film/video editor (Medical): Not on file  . Lack of Transportation (Non-Medical): Not on file  Physical Activity:   . Days of Exercise per Week: Not on file  . Minutes of Exercise per Session: Not on file  Stress:   . Feeling of Stress : Not on file  Social Connections:   . Frequency of Communication with Friends and Family: Not on file  . Frequency of Social Gatherings with Friends and Family: Not on file  . Attends Religious Services: Not on file  . Active Member of Clubs or Organizations: Not on file  . Attends Archivist Meetings: Not on file  . Marital Status: Not on file     Family History: The patient's family history includes Breast cancer in his mother; Diabetes in his mother; Hodgkin's  lymphoma in his father; Other in his paternal grandmother.  ROS:   Please see the history of present illness.    All other systems reviewed and are negative.  EKGs/Labs/Other Studies Reviewed:    The following studies were reviewed today: Myoview Stress Test 08-21-2018: Study Highlights    Nuclear stress EF: 58%.  Blood pressure demonstrated a normal response to exercise.  There was no ST segment deviation noted during stress.  This is a low risk study.  The left ventricular ejection fraction is normal (55-65%).   1. No evidence for ischemia from exercise ECG.  2. EF 58%, normal wall motion.  3. Fixed small, mild basal inferior perfusion defect.   With normal wall motion, suspect diaphragmatic artifact.  No ischemia.   Low risk study.      EKG:  EKG is not ordered today.    Recent Labs: No results found for requested labs within last 8760 hours.  Recent Lipid Panel    Component Value Date/Time   CHOL 113 03/16/2015 0919   TRIG 84.0 03/16/2015 0919   HDL 40.60 03/16/2015 0919   CHOLHDL 3 03/16/2015 0919   VLDL 16.8 03/16/2015 0919   LDLCALC 56 03/16/2015 0919    Physical Exam:    VS:  BP 132/80   Pulse 72   Ht 6\' 2"  (1.88 m)   Wt 271 lb (122.9 kg)   SpO2 95%   BMI 34.79 kg/m     Wt Readings from Last 3 Encounters:  12/01/19 271 lb (122.9 kg)  07/24/19 269 lb 12.8 oz (122.4 kg)  08/21/18 269 lb (122 kg)     GEN:  Well nourished, well developed in no acute distress HEENT: Normal NECK: No JVD; No carotid bruits LYMPHATICS: No lymphadenopathy CARDIAC: RRR, no murmurs, rubs, gallops RESPIRATORY:  Clear to auscultation without rales, wheezing or rhonchi  ABDOMEN: Soft, non-tender, non-distended MUSCULOSKELETAL:  No edema; No deformity  SKIN: Warm and dry NEUROLOGIC:  Alert and oriented x 3 PSYCHIATRIC:  Normal affect   ASSESSMENT:    1. Coronary artery disease involving native coronary artery of native heart with angina pectoris ([place])   2.  Essential hypertension   3. Mixed hyperlipidemia    PLAN:    In order of problems listed above:  1. Atypical symptoms noted. Nuclear scan last year showed no ischemia. Recommend continue current medical therapy. Discussed need to contact us if he develops exertional symptoms.  2. BP controlled on current Rx. Discussed lifestyle modification, weight loss goals.  3. Treated with rosuvastatin, last LDL 62 mg/dL. Followed by Dr Philip Aspen.   Overall he appears stable. I emphasized the importance of continued weight loss  efforts. I would like to see him back in one year for follow-up or sooner if any cardiac issues arise.    Medication Adjustments/Labs and Tests Ordered: Current medicines are reviewed at length with the patient today.  Concerns regarding medicines are outlined above.  No orders of the defined types were placed in this encounter.  No orders of the defined types were placed in this encounter.   Patient Instructions  Medication Instructions:  Your provider recommends that you continue on your current medications as directed. Please refer to the Current Medication list given to you today.   *If you need a refill on your cardiac medications before your next appointment, please call your pharmacy*   Follow-Up: At [facility], you and your health needs are our priority.  As part of our continuing mission to provide you with exceptional heart care, we have created designated Provider Care Teams.  These Care Teams include your primary Cardiologist (physician) and Advanced Practice Providers (APPs -  Physician Assistants and Nurse Practitioners) who all work together to provide you with the care you need, when you need it. Your next appointment:   12 month(s) The format for your next appointment:   In Person Provider:   You may see Sherren Mocha, MD or one of the following Advanced Practice Providers on your designated Care Team:    Richardson Dopp, PA-C  Vin Garfield, PA-C   Daune Perch, [place]    Signed, Sherren Mocha, MD  12/01/2019 5:31 PM    [place]

## 2019-12-01 NOTE — Patient Instructions (Signed)

## 2019-12-02 ENCOUNTER — Ambulatory Visit: Payer: Medicare Other | Admitting: Cardiovascular Disease

## 2020-04-16 DIAGNOSIS — Z1159 Encounter for screening for other viral diseases: Secondary | ICD-10-CM | POA: Diagnosis not present

## 2020-04-21 DIAGNOSIS — D120 Benign neoplasm of cecum: Secondary | ICD-10-CM | POA: Diagnosis not present

## 2020-04-21 DIAGNOSIS — D123 Benign neoplasm of transverse colon: Secondary | ICD-10-CM | POA: Diagnosis not present

## 2020-04-21 DIAGNOSIS — D125 Benign neoplasm of sigmoid colon: Secondary | ICD-10-CM | POA: Diagnosis not present

## 2020-04-21 DIAGNOSIS — Z86010 Personal history of colonic polyps: Secondary | ICD-10-CM | POA: Diagnosis not present

## 2020-04-21 DIAGNOSIS — D122 Benign neoplasm of ascending colon: Secondary | ICD-10-CM | POA: Diagnosis not present

## 2020-04-23 DIAGNOSIS — D125 Benign neoplasm of sigmoid colon: Secondary | ICD-10-CM | POA: Diagnosis not present

## 2020-04-23 DIAGNOSIS — D122 Benign neoplasm of ascending colon: Secondary | ICD-10-CM | POA: Diagnosis not present

## 2020-04-23 DIAGNOSIS — D123 Benign neoplasm of transverse colon: Secondary | ICD-10-CM | POA: Diagnosis not present

## 2020-04-23 DIAGNOSIS — D120 Benign neoplasm of cecum: Secondary | ICD-10-CM | POA: Diagnosis not present

## 2020-05-04 DIAGNOSIS — E7849 Other hyperlipidemia: Secondary | ICD-10-CM | POA: Diagnosis not present

## 2020-05-04 DIAGNOSIS — Z125 Encounter for screening for malignant neoplasm of prostate: Secondary | ICD-10-CM | POA: Diagnosis not present

## 2020-05-04 DIAGNOSIS — E291 Testicular hypofunction: Secondary | ICD-10-CM | POA: Diagnosis not present

## 2020-05-04 DIAGNOSIS — Z0000 Encounter for general adult medical examination without abnormal findings: Secondary | ICD-10-CM | POA: Diagnosis not present

## 2020-05-06 DIAGNOSIS — H401111 Primary open-angle glaucoma, right eye, mild stage: Secondary | ICD-10-CM | POA: Diagnosis not present

## 2020-05-11 DIAGNOSIS — R82998 Other abnormal findings in urine: Secondary | ICD-10-CM | POA: Diagnosis not present

## 2020-05-13 DIAGNOSIS — Z1212 Encounter for screening for malignant neoplasm of rectum: Secondary | ICD-10-CM | POA: Diagnosis not present

## 2020-05-25 ENCOUNTER — Ambulatory Visit
Admission: RE | Admit: 2020-05-25 | Discharge: 2020-05-25 | Disposition: A | Discharge status: Home / Self Care | Payer: Medicare Other | Source: Ambulatory Visit | Attending: Chiropractic Medicine | Admitting: Chiropractic Medicine

## 2020-05-25 ENCOUNTER — Other Ambulatory Visit: Payer: Self-pay | Admitting: Chiropractic Medicine

## 2020-05-25 ENCOUNTER — Other Ambulatory Visit: Payer: Self-pay

## 2020-05-25 DIAGNOSIS — M545 Low back pain, unspecified: Secondary | ICD-10-CM

## 2020-05-25 DIAGNOSIS — G8929 Other chronic pain: Secondary | ICD-10-CM

## 2020-11-15 DIAGNOSIS — H25012 Cortical age-related cataract, left eye: Secondary | ICD-10-CM | POA: Diagnosis not present

## 2020-11-15 DIAGNOSIS — H401111 Primary open-angle glaucoma, right eye, mild stage: Secondary | ICD-10-CM | POA: Diagnosis not present

## 2020-12-07 ENCOUNTER — Ambulatory Visit: Payer: Medicare Other | Admitting: Physician Assistant

## 2020-12-16 DIAGNOSIS — D1801 Hemangioma of skin and subcutaneous tissue: Secondary | ICD-10-CM | POA: Diagnosis not present

## 2020-12-16 DIAGNOSIS — L309 Dermatitis, unspecified: Secondary | ICD-10-CM | POA: Diagnosis not present

## 2020-12-16 DIAGNOSIS — L821 Other seborrheic keratosis: Secondary | ICD-10-CM | POA: Diagnosis not present

## 2020-12-16 DIAGNOSIS — Z85828 Personal history of other malignant neoplasm of skin: Secondary | ICD-10-CM | POA: Diagnosis not present

## 2020-12-18 NOTE — Progress Notes (Signed)
Virtual Visit via Video Note   This visit type was conducted due to national recommendations for restrictions regarding the COVID-19 Pandemic (e.g. social distancing) in an effort to limit this patient's exposure and mitigate transmission in our community.  Due to his co-morbid illnesses, this patient is at least at moderate risk for complications without adequate follow up.  This format is felt to be most appropriate for this patient at this time.  All issues noted in this document were discussed and addressed.  A limited physical exam was performed with this format.  Please refer to the patient's chart for his consent to telehealth for [facility].       Date:  12/20/2020   ID:  [name], DOB 04-04-1951, MRN [id number] The patient was identified using 2 identifiers.  Patient Location: Home Provider Location: Home Office  PCP:  Leanna Battles, MD  Cardiologist:  Sherren Mocha, MD   Electrophysiologist:  None   Evaluation Performed:  Follow-Up Visit  Chief Complaint:  Chest Pain and Shortness of Breath    Patient Profile: Stratford LUTH is a 70 y.o. male with:  Coronary artery disease   S/p ACS in 2007 >> PCI:  BMS x 2 to RCA (overlapping) - [place], [place]  ETT in 2017: no ST changes, normal ex tol  Myoview 9/19: low risk   Hypertension   Hyperlipidemia   OSA  Hx of CVA   Prior CV Studies: Myoview 08/21/18 EF 58, fixed inf defect w normal wall motion (probable diaph atten), no ischemia; low risk   ETT 05/11/16 Low risk   Echocardiogram 06/20/12 Dist septal HK, mild LVH, EF 50-55, trivial AI, mild LAE   History of Present Illness:   Mr. Klepacki was last seen by Dr. Burt Knack in 11/2019.  He is seen for f/u.   Over the past several months, he has noted vague chest discomfort from time to time.  He also feels short of breath.  He feels that the discomfort radiates to his back.  It is not necessarily brought on by exertion.  He does not have  associated arm, jaw pain.  He does not have associated nausea or diaphoresis.  It is not like his previous angina.  He does feel lightheaded from time to time.  He describes it as brain fog.  He has not had a spinning sensation.  It may occur when he stands up at times.  He does have significant issues with neuropathy which limits his activity.  He also feels like his balance is off.  He has not had syncope, orthopnea, PND.  He does have mild ankle edema which is chronic and unchanged.  This seems to be dependent.   Past Medical History:  Diagnosis Date  . Arthritis    OA   . CAD (coronary artery disease)    S/p ACS in 2007 >> PCI:  BMS x 2 to RCA (overlapping) - [place], [place] // ETT in 2017: no ST changes, normal ex tol // Myoview 9/19: EF 58, fixed inf defect w normal wall motion (probable diaph atten), no ischemia; low risk   . Cancer (HCC)    BASAL CELL CARCINOMA  . Fatigue   . Heart attack ([place])    2007 DR. Burt Knack   . Hepatitis   . History of rheumatic fever   . HTN (hypertension)   . Hx of CVA    04/2012  . Hyperlipemia   . OSA (obstructive sleep apnea)    CPAP  Past Surgical History:  Procedure Laterality Date  . BASAL CELL CARCINOMA EXCISION     from forehead  . CORONARY ANGIOPLASTY WITH STENT PLACEMENT  2007  . PAROTIDECTOMY Left 09/04/2014   Procedure: PAROTIDECTOMY;  Surgeon: Melida Quitter, MD;  Location: [facility];  Service: ENT;  Laterality: Left;  . SEPTOPLASTY  1984  . TOTAL HIP ARTHROPLASTY Right 11/2011  . TOTAL HIP ARTHROPLASTY Left 04/2012  . UMBILICAL HERNIA REPAIR  1998     Current Meds  Medication Sig  . aspirin 81 MG tablet Take 1 tablet (81 mg total) by mouth daily.  . B COMPLEX-C PO Take 1 tablet by mouth daily.  . Cholecalciferol (VITAMIN D3) 5000 UNITS CAPS Take 5,000 Units by mouth daily.  . Coenzyme Q10 300 MG CAPS Take 300 mg by mouth 2 (two) times daily.  Marland Kitchen ibuprofen (ADVIL,MOTRIN) 200 MG tablet Take 200 mg by mouth every 6 (six) hours as needed for  mild pain.  Marland Kitchen l-methylfolate-B6-B12 (METANX) 3-35-2 MG TABS tablet Take 1 capsule by mouth daily.  Marland Kitchen latanoprost (XALATAN) 0.005 % ophthalmic solution Place 1 drop into both eyes at bedtime.  . nitroGLYCERIN (NITROSTAT) 0.4 MG SL tablet Place 1 tablet (0.4 mg total) under the tongue every 5 (five) minutes as needed for chest pain.  . Omega-3 Fatty Acids (FISH OIL) 1000 MG CAPS Take 1 capsule by mouth daily.  . OMEGA-3 KRILL OIL PO Take 1 capsule by mouth daily.  . potassium chloride (K-DUR) 10 MEQ tablet Take 1 tablet by mouth daily for 2 days then take 1 tablet by mouth daily only as needed for edema  . vitamin E 400 UNIT capsule Take 400 Units by mouth 2 (two) times daily.  . [DISCONTINUED] metoprolol succinate (TOPROL-XL) 25 MG 24 hr tablet Take 12.5 mg by mouth daily.  . [DISCONTINUED] rosuvastatin (CRESTOR) 10 MG tablet TAKE 1 TABLET BY MOUTH 3 TIMES PER WEEK  . [DISCONTINUED] triamterene-hydrochlorothiazide (DYAZIDE) 37.5-25 MG capsule Take 1 capsule by mouth daily.     Allergies:   Latex, Sulfa antibiotics, Sulfonamide derivatives, and Tape   Social History   Tobacco Use  . Smoking status: Never Smoker  . Smokeless tobacco: Never Used  Substance Use Topics  . Alcohol use: Yes  . Drug use: No     Family Hx: The patient's family history includes Breast cancer in his mother; Diabetes in his mother; Hodgkin's lymphoma in his father; Other in his paternal grandmother.  ROS:   Please see the history of present illness.    He has not had fevers, chills, melena, hematochezia, hematuria, vomiting, diarrhea.  He does have a chronic cough that is unchanged.   Labs/Other Tests and Data Reviewed:    EKG:  No ECG reviewed.  Recent Labs: No results found for requested labs within last 8760 hours.   Recent Lipid Panel Lab Results  Component Value Date/Time   CHOL 113 03/16/2015 09:19 AM   TRIG 84.0 03/16/2015 09:19 AM   HDL 40.60 03/16/2015 09:19 AM   CHOLHDL 3 03/16/2015 09:19  AM   LDLCALC 56 03/16/2015 09:19 AM   Labs faxed from PCP - personally reviewed and interpreted: 04/25/2019: Cr 0.9, K 3.8, ALT 23, Hgb 16.1, TC 129, HDL 43, LDL 62, TG 118  Labs obtained through [facility] - personally reviewed and interpreted: 05/04/2020: LDL 57, triglycerides 164, Hgb 16.8, creatinine 1.1, ALT 26  Wt Readings from Last 3 Encounters:  12/20/20 262 lb (118.8 kg)  12/01/19 271 lb (122.9 kg)  07/24/19 269 lb 12.8 oz (122.4 kg)     Risk Assessment/Calculations:      Objective:    Vital Signs:  BP 125/76   Ht 6\' 2"  (1.88 m)   Wt 262 lb (118.8 kg)   BMI 33.64 kg/m    VITAL SIGNS:  reviewed GEN:  no acute distress EYES:  Sclera anicteric RESPIRATORY:  Normal respiratory effort PSYCH:  normal affect  ASSESSMENT & PLAN:    1. Coronary artery disease involving native coronary artery of native heart with angina pectoris ([place]) 2. Shortness of breath S/p ACS in 2007 in [place], [place].  Review of his chart indicates his cath at that time demonstrated a thrombotic occlusion of the RCA and he had no disease elsewhere.  He was tx with BMS x 2 to the RCA.  His EF was normal by echocardiogram in 2013.  Myoview in 2019 was low risk.  Over the past few months, he has noted occasional chest discomfort.  It is described as an ache.  It is not necessarily brought on by exertion and is not like his previous angina.  He also feels more short of breath but does not describe any heart failure symptoms.  We discussed the limitations of a video visit.  I cannot listen to his heart or do an EKG today.  However, I think proceeding with a stress test and echocardiogram is reasonable.  The last EKG in his chart was from 2020.  Therefore, I will set him up for a nurse visit first.  As long as his EKG is not significantly changed, we will proceed with a stress test and echocardiogram.  -Nurse visit to check electrocardiogram  -Lexiscan Myoview  -2D echocardiogram  -CMET, TSH, CBC   -In person  follow-up 4-6 weeks  3. Dizziness Etiology of his dizziness is not entirely clear.  It may be related somewhat to his neuropathy.  He also seems to describe some orthostasis.  I have encouraged him to try to check his blood pressure from sitting to standing if he feels symptoms and notify us if he documents a significant blood pressure drop.  I have also encouraged him to try to get compression socks to see if this helps.  Proceed with stress testing and echocardiogram as outlined above to evaluate chest discomfort and shortness of breath.  If he continues to have symptoms, we may need to consider a cardiac monitor.  4. Essential hypertension The patient's blood pressure is controlled on his current regimen.  Continue current therapy.   5. Mixed hyperlipidemia LDL optimal on most recent lab work.  Continue current Rx.      Shared Decision Making/Informed Consent The risks [chest pain, shortness of breath, cardiac arrhythmias, dizziness, blood pressure fluctuations, myocardial infarction, stroke/transient ischemic attack, nausea, vomiting, allergic reaction, radiation exposure, metallic taste sensation and life-threatening complications (estimated to be 1 in 10,000)], benefits (risk stratification, diagnosing coronary artery disease, treatment guidance) and alternatives of a nuclear stress test were discussed in detail with Mr. Burch and he agrees to proceed.   Time:   Today, I have spent 20 minutes with the patient with telehealth technology discussing the above problems.     Medication Adjustments/Labs and Tests Ordered: Current medicines are reviewed at length with the patient today.  Concerns regarding medicines are outlined above.   Tests Ordered: Orders Placed This Encounter  Procedures  . Comprehensive metabolic panel  . CBC  . TSH  . Cardiac Stress Test: Informed Consent Details: Physician/Practitioner Attestation; Transcribe  to consent form and obtain patient signature  .  MYOCARDIAL PERFUSION IMAGING  . ECHOCARDIOGRAM COMPLETE    Medication Changes: Meds ordered this encounter  Medications  . metoprolol succinate (TOPROL-XL) 25 MG 24 hr tablet    Sig: Take 0.5 tablets (12.5 mg total) by mouth daily.    Dispense:  45 tablet    Refill:  3  . triamterene-hydrochlorothiazide (DYAZIDE) 37.5-25 MG capsule    Sig: Take 1 each (1 capsule total) by mouth daily.    Dispense:  90 capsule    Refill:  3  . rosuvastatin (CRESTOR) 10 MG tablet    Sig: Take 1 tablet (10 mg total) by mouth 3 (three) times a week.    Dispense:  45 tablet    Refill:  3  . nitroGLYCERIN (NITROSTAT) 0.4 MG SL tablet    Sig: Place 1 tablet (0.4 mg total) under the tongue every 5 (five) minutes as needed for chest pain.    Dispense:  25 tablet    Refill:  3    Follow Up:  In Person in 6 week(s)  Signed, Richardson Dopp, PA-C  12/20/2020 1:38 PM    [facility]

## 2020-12-19 ENCOUNTER — Encounter: Payer: Self-pay | Admitting: Physician Assistant

## 2020-12-20 ENCOUNTER — Telehealth (INDEPENDENT_AMBULATORY_CARE_PROVIDER_SITE_OTHER): Payer: Medicare Other | Admitting: Physician Assistant

## 2020-12-20 ENCOUNTER — Other Ambulatory Visit: Payer: Self-pay

## 2020-12-20 ENCOUNTER — Telehealth: Payer: Self-pay

## 2020-12-20 ENCOUNTER — Encounter: Payer: Self-pay | Admitting: Physician Assistant

## 2020-12-20 VITALS — BP 125/76 | Ht 74.0 in | Wt 262.0 lb

## 2020-12-20 DIAGNOSIS — R0602 Shortness of breath: Secondary | ICD-10-CM

## 2020-12-20 DIAGNOSIS — R42 Dizziness and giddiness: Secondary | ICD-10-CM

## 2020-12-20 DIAGNOSIS — I25119 Atherosclerotic heart disease of native coronary artery with unspecified angina pectoris: Secondary | ICD-10-CM | POA: Diagnosis not present

## 2020-12-20 DIAGNOSIS — I10 Essential (primary) hypertension: Secondary | ICD-10-CM

## 2020-12-20 DIAGNOSIS — E782 Mixed hyperlipidemia: Secondary | ICD-10-CM

## 2020-12-20 DIAGNOSIS — R072 Precordial pain: Secondary | ICD-10-CM

## 2020-12-20 DIAGNOSIS — I2510 Atherosclerotic heart disease of native coronary artery without angina pectoris: Secondary | ICD-10-CM

## 2020-12-20 MED ORDER — TRIAMTERENE-HCTZ 37.5-25 MG PO CAPS: 1.0000 | ORAL_CAPSULE | Freq: Every day | ORAL | 3 refills | Status: DC

## 2020-12-20 MED ORDER — NITROGLYCERIN 0.4 MG SL SUBL: 0.4000 mg | SUBLINGUAL_TABLET | SUBLINGUAL | 3 refills | Status: DC | PRN

## 2020-12-20 MED ORDER — ROSUVASTATIN CALCIUM 10 MG PO TABS: 10.0000 mg | ORAL_TABLET | ORAL | 3 refills | Status: AC

## 2020-12-20 MED ORDER — METOPROLOL SUCCINATE ER 25 MG PO TB24: 12.5000 mg | ORAL_TABLET | Freq: Every day | ORAL | 3 refills | Status: DC

## 2020-12-20 NOTE — Patient Instructions (Signed)
Medication Instructions:  Your physician has recommended you make the following change in your medication:   1) Start Nitroglycerin 0.4 mg, 1 tablet under the tongue as needed for chest pain  *If you need a refill on your cardiac medications before your next appointment, please call your pharmacy*  Lab Work: Your physician recommends that you return for lab work on 12/23/20 for TSH/CBC/CMET  Testing/Procedures: Your physician has requested that you have an echocardiogram. Echocardiography is a painless test that uses sound waves to create images of your heart. It provides your doctor with information about the size and shape of your heart and how well your heart's chambers and valves are working. This procedure takes approximately one hour. There are no restrictions for this procedure.  Your physician has requested that you have a lexiscan myoview. For further information please visit [email or web address]. Please follow instruction sheet, as given.  Follow-Up: At [facility], you and your health needs are our priority.  As part of our continuing mission to provide you with exceptional heart care, we have created designated Provider Care Teams.  These Care Teams include your primary Cardiologist (physician) and Advanced Practice Providers (APPs -  Physician Assistants and Nurse Practitioners) who all work together to provide you with the care you need, when you need it.  Your next appointment:   4-6 week(s) after testing  The format for your next appointment:   In Person  Provider:   You may see Sherren Mocha, MD or Richardson Dopp, PA-C

## 2020-12-20 NOTE — Telephone Encounter (Signed)
  Patient Consent for Virtual Visit         Tyler Bradley has provided verbal consent on 12/20/2020 for a virtual visit (video or telephone).   CONSENT FOR VIRTUAL VISIT FOR:  Tyler Bradley  By participating in this virtual visit I agree to the following:  I hereby voluntarily request, consent and authorize [facility] and its employed or contracted physicians, Engineer, materials, nurse practitioners or other licensed health care professionals (the Practitioner), to provide me with telemedicine health care services (the "Services") as deemed necessary by the treating Practitioner. I acknowledge and consent to receive the Services by the Practitioner via telemedicine. I understand that the telemedicine visit will involve communicating with the Practitioner through live audiovisual communication technology and the disclosure of certain medical information by electronic transmission. I acknowledge that I have been given the opportunity to request an in-person assessment or other available alternative prior to the telemedicine visit and am voluntarily participating in the telemedicine visit.  I understand that I have the right to withhold or withdraw my consent to the use of telemedicine in the course of my care at any time, without affecting my right to future care or treatment, and that the Practitioner or I may terminate the telemedicine visit at any time. I understand that I have the right to inspect all information obtained and/or recorded in the course of the telemedicine visit and may receive copies of available information for a reasonable fee.  I understand that some of the potential risks of receiving the Services via telemedicine include:  Marland Kitchen Delay or interruption in medical evaluation due to technological equipment failure or disruption; . Information transmitted may not be sufficient (e.g. poor resolution of images) to allow for appropriate medical decision making by the  Practitioner; and/or  . In rare instances, security protocols could fail, causing a breach of personal health information.  Furthermore, I acknowledge that it is my responsibility to provide information about my medical history, conditions and care that is complete and accurate to the best of my ability. I acknowledge that Practitioner's advice, recommendations, and/or decision may be based on factors not within their control, such as incomplete or inaccurate data provided by me or distortions of diagnostic images or specimens that may result from electronic transmissions. I understand that the practice of medicine is not an exact science and that Practitioner makes no warranties or guarantees regarding treatment outcomes. I acknowledge that a copy of this consent can be made available to me via my patient portal ([place]), or I can request a printed copy by calling the office of [facility].    I understand that my insurance will be billed for this visit.   I have read or had this consent read to me. . I understand the contents of this consent, which adequately explains the benefits and risks of the Services being provided via telemedicine.  . I have been provided ample opportunity to ask questions regarding this consent and the Services and have had my questions answered to my satisfaction. . I give my informed consent for the services to be provided through the use of telemedicine in my medical care

## 2020-12-23 ENCOUNTER — Other Ambulatory Visit: Payer: Self-pay

## 2020-12-23 ENCOUNTER — Other Ambulatory Visit: Payer: Medicare Other | Admitting: *Deleted

## 2020-12-23 ENCOUNTER — Telehealth: Payer: Self-pay

## 2020-12-23 ENCOUNTER — Encounter (HOSPITAL_COMMUNITY): Payer: Self-pay | Admitting: Physician Assistant

## 2020-12-23 ENCOUNTER — Ambulatory Visit (INDEPENDENT_AMBULATORY_CARE_PROVIDER_SITE_OTHER): Payer: Medicare Other | Admitting: *Deleted

## 2020-12-23 ENCOUNTER — Encounter: Payer: Self-pay | Admitting: *Deleted

## 2020-12-23 VITALS — BP 142/82 | HR 78 | Ht 74.0 in | Wt 281.1 lb

## 2020-12-23 DIAGNOSIS — R0602 Shortness of breath: Secondary | ICD-10-CM | POA: Diagnosis not present

## 2020-12-23 DIAGNOSIS — I25119 Atherosclerotic heart disease of native coronary artery with unspecified angina pectoris: Secondary | ICD-10-CM | POA: Diagnosis not present

## 2020-12-23 DIAGNOSIS — R072 Precordial pain: Secondary | ICD-10-CM

## 2020-12-23 DIAGNOSIS — I10 Essential (primary) hypertension: Secondary | ICD-10-CM

## 2020-12-23 DIAGNOSIS — R42 Dizziness and giddiness: Secondary | ICD-10-CM

## 2020-12-23 NOTE — Progress Notes (Unsigned)
Pt here today for EKG at the request of Richardson Dopp, [place].  Pt denies any complaints at this appt.  EKG demonstrated NSR HR 80.

## 2020-12-23 NOTE — Telephone Encounter (Signed)
Detailed instructions left on the patient's answering machine. Asked to call back with any questions. S.Erandi Lemma EMTP 

## 2020-12-23 NOTE — Progress Notes (Signed)
Pt here today for EKG per [facility] request.  EKG demonstrated NSR with HR 78.  Pt denies any complaints or questions.  He has been scheduled for the stress test as ordered by [facility].

## 2020-12-23 NOTE — Patient Instructions (Signed)
Medication Instructions:  The current medical regimen is effective;  continue present plan and medications.  *If you need a refill on your cardiac medications before your next appointment, please call your pharmacy*  Follow-Up: At [facility], you and your health needs are our priority.  As part of our continuing mission to provide you with exceptional heart care, we have created designated Provider Care Teams.  These Care Teams include your primary Cardiologist (physician) and Advanced Practice Providers (APPs -  Physician Assistants and Nurse Practitioners) who all work together to provide you with the care you need, when you need it.  We recommend signing up for the patient portal called "MyChart".  Sign up information is provided on this After Visit Summary.  MyChart is used to connect with patients for Virtual Visits (Telemedicine).  Patients are able to view lab/test results, encounter notes, upcoming appointments, etc.  Non-urgent messages can be sent to your provider as well.   To learn more about what you can do with MyChart, go to [email or web address].    Your next appointment:   As scheduled.  Thank you for choosing [facility]!!    

## 2020-12-24 LAB — COMPREHENSIVE METABOLIC PANEL
ALT: 38 IU/L (ref 0–44)
AST: 32 IU/L (ref 0–40)
Albumin/Globulin Ratio: 1.8 (ref 1.2–2.2)
Albumin: 4.3 g/dL (ref 3.7–4.7)
Alkaline Phosphatase: 74 IU/L (ref 44–121)
BUN/Creatinine Ratio: 13 (ref 10–24)
BUN: 15 mg/dL (ref 8–27)
Bilirubin Total: 0.8 mg/dL (ref 0.0–1.2)
CO2: 25 mmol/L (ref 20–29)
Calcium: 9.3 mg/dL (ref 8.6–10.2)
Chloride: 102 mmol/L (ref 96–106)
Creatinine, Ser: 1.12 mg/dL (ref 0.76–1.27)
GFR calc Af Amer: 76 mL/min/{1.73_m2} (ref >59)
GFR calc non Af Amer: 66 mL/min/{1.73_m2} (ref >59)
Globulin, Total: 2.4 g/dL (ref 1.5–4.5)
Glucose: 137 mg/dL — ABNORMAL HIGH (ref 65–99)
Potassium: 3.8 mmol/L (ref 3.5–5.2)
Sodium: 139 mmol/L (ref 134–144)
Total Protein: 6.7 g/dL (ref 6.0–8.5)

## 2020-12-24 LAB — CBC
Hematocrit: 45.3 % (ref 37.5–51.0)
Hemoglobin: 15.6 g/dL (ref 13.0–17.7)
MCH: 30.3 pg (ref 26.6–33.0)
MCHC: 34.4 g/dL (ref 31.5–35.7)
MCV: 88 fL (ref 79–97)
Platelets: 212 10*3/uL (ref 150–450)
RBC: 5.15 x10E6/uL (ref 4.14–5.80)
RDW: 12.7 % (ref 11.6–15.4)
WBC: 6.4 10*3/uL (ref 3.4–10.8)

## 2020-12-24 LAB — TSH: TSH: 2.95 u[IU]/mL (ref 0.450–4.500)

## 2020-12-27 NOTE — Progress Notes (Signed)
Reviewed EKG.  No acute ST-TW changes.  Proceed with Myoview as planned.  Richardson Dopp, PA-C    12/27/2020 9:29 AM

## 2020-12-28 ENCOUNTER — Other Ambulatory Visit: Payer: Self-pay

## 2020-12-28 ENCOUNTER — Ambulatory Visit (HOSPITAL_COMMUNITY): Payer: Medicare Other | Attending: Cardiology

## 2020-12-28 DIAGNOSIS — R072 Precordial pain: Secondary | ICD-10-CM | POA: Insufficient documentation

## 2020-12-28 LAB — MYOCARDIAL PERFUSION IMAGING
LV dias vol: 105 mL (ref 62–150)
LV sys vol: 50 mL
Peak HR: 114 {beats}/min
Rest HR: 75 {beats}/min
SDS: 1
SRS: 0
SSS: 1
TID: 1.08

## 2020-12-28 MED ORDER — REGADENOSON 0.4 MG/5ML IV SOLN
0.4000 mg | Freq: Once | INTRAVENOUS | Status: AC
  Administered 2020-12-28: 0.4 mg via INTRAVENOUS

## 2020-12-28 MED ORDER — TECHNETIUM TC 99M TETROFOSMIN IV KIT
32.5000 | PACK | Freq: Once | INTRAVENOUS | Status: AC | PRN
  Administered 2020-12-28: 32.5 via INTRAVENOUS
  Filled 2020-12-28: qty 33

## 2020-12-28 MED ORDER — TECHNETIUM TC 99M TETROFOSMIN IV KIT
10.5000 | PACK | Freq: Once | INTRAVENOUS | Status: AC | PRN
  Administered 2020-12-28: 10.5 via INTRAVENOUS
  Filled 2020-12-28: qty 11

## 2020-12-29 ENCOUNTER — Encounter: Payer: Self-pay | Admitting: Physician Assistant

## 2021-01-14 ENCOUNTER — Ambulatory Visit (HOSPITAL_COMMUNITY): Payer: Medicare Other | Attending: Cardiology

## 2021-01-14 ENCOUNTER — Telehealth: Payer: Self-pay

## 2021-01-14 ENCOUNTER — Other Ambulatory Visit: Payer: Self-pay

## 2021-01-14 ENCOUNTER — Encounter: Payer: Self-pay | Admitting: Physician Assistant

## 2021-01-14 DIAGNOSIS — R072 Precordial pain: Secondary | ICD-10-CM | POA: Diagnosis not present

## 2021-01-14 DIAGNOSIS — I25119 Atherosclerotic heart disease of native coronary artery with unspecified angina pectoris: Secondary | ICD-10-CM | POA: Diagnosis not present

## 2021-01-14 DIAGNOSIS — I712 Thoracic aortic aneurysm, without rupture, unspecified: Secondary | ICD-10-CM

## 2021-01-14 DIAGNOSIS — R0602 Shortness of breath: Secondary | ICD-10-CM

## 2021-01-14 DIAGNOSIS — I77810 Thoracic aortic ectasia: Secondary | ICD-10-CM | POA: Insufficient documentation

## 2021-01-14 LAB — ECHOCARDIOGRAM COMPLETE
Area-P 1/2: 5.13 cm2
S' Lateral: 3.9 cm

## 2021-01-14 NOTE — Telephone Encounter (Signed)
RN spoke with patient regarding results: Echocardiogram shows normal heart function (ejection fraction). There is dilation of the aortic root and ascending aorta.  PLAN:  - Continue current medications/treatment plan and follow up as scheduled.  - Arrange Chest CTA to better size the dilated ascending aorta (Dx: dilated thoracic aorta)  - Send copy to PCP  RN stated the Chest CTA would be ordered.

## 2021-01-14 NOTE — Telephone Encounter (Signed)
-----   Message from [name], [place] sent at 01/14/2021  2:05 PM EST ----- Echocardiogram shows normal heart function (ejection fraction).  There is dilation of the aortic root and ascending aorta. PLAN:  - Continue current medications/treatment plan and follow up as scheduled.  - Arrange Chest CTA to better size the dilated ascending aorta (Dx: dilated thoracic aorta) - Send copy to PCP Richardson Dopp, PA-C    01/14/2021 2:00 PM

## 2021-01-19 ENCOUNTER — Other Ambulatory Visit: Payer: Self-pay

## 2021-01-19 ENCOUNTER — Ambulatory Visit (INDEPENDENT_AMBULATORY_CARE_PROVIDER_SITE_OTHER)
Admission: RE | Admit: 2021-01-19 | Discharge: 2021-01-19 | Disposition: A | Discharge status: Home / Self Care | Payer: Medicare Other | Source: Ambulatory Visit | Attending: Physician Assistant | Admitting: Physician Assistant

## 2021-01-19 DIAGNOSIS — I712 Thoracic aortic aneurysm, without rupture, unspecified: Secondary | ICD-10-CM

## 2021-01-19 DIAGNOSIS — R911 Solitary pulmonary nodule: Secondary | ICD-10-CM | POA: Diagnosis not present

## 2021-01-19 DIAGNOSIS — K8020 Calculus of gallbladder without cholecystitis without obstruction: Secondary | ICD-10-CM | POA: Diagnosis not present

## 2021-01-19 MED ORDER — IOHEXOL 350 MG/ML SOLN
100.0000 mL | Freq: Once | INTRAVENOUS | Status: AC | PRN
  Administered 2021-01-19: 100 mL via INTRAVENOUS

## 2021-01-20 ENCOUNTER — Encounter: Payer: Self-pay | Admitting: Physician Assistant

## 2021-01-20 ENCOUNTER — Other Ambulatory Visit: Payer: Self-pay | Admitting: Physician Assistant

## 2021-01-20 DIAGNOSIS — I712 Thoracic aortic aneurysm, without rupture, unspecified: Secondary | ICD-10-CM

## 2021-01-20 DIAGNOSIS — R911 Solitary pulmonary nodule: Secondary | ICD-10-CM

## 2021-01-20 DIAGNOSIS — I700 Atherosclerosis of aorta: Secondary | ICD-10-CM | POA: Insufficient documentation

## 2021-03-08 NOTE — Progress Notes (Addendum)
Cardiology Office Note:    Date:  03/09/2021   ID:  Tyler Bradley, DOB 1951-05-19, MRN [id number]  PCP:  Leanna Battles, [facility]  Cardiologist:  Sherren Mocha, MD   Electrophysiologist:  None       Referring MD: Leanna Battles, MD   Chief Complaint:  Follow-up (Chest pain - recent Myoview, Echocardiogram, Chest CT)    Patient Profile:     Tyler Bradley is a 70 y.o. male with:   Coronary artery disease  ? S/p ACS in 2007 >> PCI:  BMS x 2 to RCA (overlapping) [place], [place] ? ETT in 2017: no ST changes, normal ex tol ? Myoview 9/19: low risk  ? Myoview 1/22: low risk   Ascending thoracic aortic aneurysm   CT 2/22: 4.3 cm>> repeat 1 year  Pulmonary nodule  CT 2/22: Right lung base 8 mm>> repeat CT 6 months  Hypertension   Hyperlipidemia   OSA  Hx of CVA   Prior CV Studies:  CHEST CTA 01/19/21 Ascending thoracic aortic aneurysm 4.3 cm Right lung base pulmonary nodule 8 mm Aortic atherosclerosis Cholelithiasis  GATED SPECT MYO PERF W/LEXISCAN STRESS 1D 12/28/2020 1. Fixed inferior perfusion defect with hypokinesis in this region, consistent with prior infarct 2. Mild systolic dysfunction (EF 19%). 2. Low risk study.  No ischemia.  ECHOCARDIOGRAM 01/14/21 EF 55-60, no RWMA, mild LVH, GR 1 DD, normal RVSF, mild dilation of ascending aorta (43 mm)  Myoview 08/21/18 EF 58, fixed inf defect w normal wall motion (probable diaph atten), no ischemia; low risk   ETT 05/11/16 Low risk   Echocardiogram 06/20/12 Dist septal HK, mild LVH, EF 50-55, trivial AI, mild LAE No results found for this or any previous visit from the past 3650 days.    History of Present Illness:    Tyler Bradley was last seen in 12/2020 via Telemedicine.  He had symptoms of chest pain. I arranged a Myoview and Echocardiogram.   His stress test was low risk and negative for ischemia.  His echocardiogram demonstrated normal LV function,  mild diastolic dysfunction and no significant valvular disease.  His aorta was dilated at 43 mm.  A CT scan was obtained and demonstrated thoracic aortic aneurysm measuring 4.3 cm.  There was also a right lower lobe pulmonary nodule.  He will need a chest MRA in 1 year and follow-up CT without contrast in 6 months.  He returns for follow-up.  He is here today with his wife.  She is a retired Marine scientist.  Patient still has an occasional ache in his chest.  This is not related to exertion or meals.  He is able to exert himself without chest discomfort.  He has not had significant shortness of breath.  He has not had orthopnea.  He has dependent pedal edema.  He has not had syncope.  He has not had any issues with dizziness or "brain fog" in the last several weeks.    Past Medical History:  Diagnosis Date  . Aortic atherosclerosis ([place])    Noted on CT in 01/2021  . Arthritis    OA   . CAD (coronary artery disease)    S/p ACS in 2007 >> PCI:  BMS x 2 to RCA (overlapping) - [place], [place] // ETT in 2017: no ST changes, normal ex tol // Myoview 9/19: EF 58, fixed inf defect w normal wall motion (probable diaph atten), no ischemia; low risk // Myoview 1/22: EF 52,  inf infarct, no ischemia; low risk   . Cancer (HCC)    BASAL CELL CARCINOMA  . Fatigue   . Heart attack ([place])    2007 DR. Burt Knack   . Hepatitis   . History of rheumatic fever   . HTN (hypertension)   . Hx of CVA    04/2012  . Hyperlipemia   . Lung nodule    Chest CTA 2/22: 8 mm RLL nodule   . OSA (obstructive sleep apnea)    CPAP   . Thoracic aortic aneurysm ([place])    Chest CTA 2/22: Asc thoracic Ao aneurysm 4.3 cm; 8 mm RLL nodule; cholelithiasis; aortic atherosclerosis // Echo 2/22: EF 55-60, no RWMA, mild LVH, GR 1 DD, normal RVSF, dilated aortic root (44 mm), dilated ascending aorta (43 mm)    Current Medications: Current Meds  Medication Sig  . aspirin 81 MG tablet Take 1 tablet (81 mg total) by mouth daily.  . B COMPLEX-C PO Take 1  tablet by mouth daily.  . Cholecalciferol (VITAMIN D3) 5000 UNITS CAPS Take 5,000 Units by mouth daily.  . Coenzyme Q10 300 MG CAPS Take 300 mg by mouth 2 (two) times daily.  Marland Kitchen ibuprofen (ADVIL,MOTRIN) 200 MG tablet Take 200 mg by mouth every 6 (six) hours as needed for mild pain.  Marland Kitchen l-methylfolate-B6-B12 (METANX) 3-35-2 MG TABS tablet Take 1 capsule by mouth daily.  Marland Kitchen latanoprost (XALATAN) 0.005 % ophthalmic solution Place 1 drop into both eyes at bedtime.  . metoprolol succinate (TOPROL-XL) 25 MG 24 hr tablet Take 0.5 tablets (12.5 mg total) by mouth daily.  . Omega-3 Fatty Acids (FISH OIL) 1000 MG CAPS Take 1 capsule by mouth daily.  . OMEGA-3 KRILL OIL PO Take 1 capsule by mouth daily.  . rosuvastatin (CRESTOR) 10 MG tablet Take 1 tablet (10 mg total) by mouth 3 (three) times a week.  . triamterene-hydrochlorothiazide (DYAZIDE) 37.5-25 MG capsule Take 1 each (1 capsule total) by mouth daily.  . vitamin E 400 UNIT capsule Take 400 Units by mouth 2 (two) times daily.     Allergies:   Latex, Sulfa antibiotics, Sulfonamide derivatives, and Tape   Social History   Tobacco Use  . Smoking status: Never Smoker  . Smokeless tobacco: Never Used  Substance Use Topics  . Alcohol use: Yes  . Drug use: No     Family Hx: The patient's family history includes Breast cancer in his mother; Diabetes in his mother; Hodgkin's lymphoma in his father; Other in his paternal grandmother.  ROS   EKGs/Labs/Other Test Reviewed:    EKG:  EKG is not ordered today.  The ekg ordered today demonstrates n/a  Recent Labs: 12/23/2020: ALT 38; BUN 15; Creatinine, Ser 1.12; Hemoglobin 15.6; Platelets 212; Potassium 3.8; Sodium 139; TSH 2.950   Recent Lipid Panel Lab Results  Component Value Date/Time   CHOL 113 03/16/2015 09:19 AM   TRIG 84.0 03/16/2015 09:19 AM   HDL 40.60 03/16/2015 09:19 AM   CHOLHDL 3 03/16/2015 09:19 AM   LDLCALC 56 03/16/2015 09:19 AM   Labs faxed from PCP - personally reviewed and  interpreted: 04/25/2019: Cr 0.9, K 3.8, ALT 23, Hgb 16.1, TC 129, HDL 43, LDL 62, TG 118  Labs obtained through [facility] - personally reviewed and interpreted: 05/04/2020: LDL 57, triglycerides 164, Hgb 16.8, creatinine 1.1, ALT 26   Risk Assessment/Calculations:      Physical Exam:    VS:  BP 116/72   Pulse 93   Ht 6'  2" (1.88 m)   Wt 274 lb 6.4 oz (124.5 kg)   SpO2 97%   BMI 35.23 kg/m     Wt Readings from Last 3 Encounters:  03/09/21 274 lb 6.4 oz (124.5 kg)  12/28/20 281 lb (127.5 kg)  12/23/20 281 lb 2 oz (127.5 kg)     Constitutional:      Appearance: Healthy appearance. Not in distress.  Neck:     Vascular: No carotid bruit or JVR. JVD normal.  Pulmonary:     Effort: Pulmonary effort is normal.     Breath sounds: No wheezing. No rales.  Cardiovascular:     Normal rate. Regular rhythm. Normal S1. Normal S2.     Murmurs: There is no murmur.  Edema:    Peripheral edema absent.  Abdominal:     Palpations: Abdomen is soft. There is no hepatomegaly.  Skin:    General: Skin is warm and dry.  Neurological:     General: No focal deficit present.     Mental Status: Alert and oriented to person, place and time.     Cranial Nerves: Cranial nerves are intact.         ASSESSMENT & PLAN:    1. Coronary artery disease involving native coronary artery of native heart without angina pectoris S/p ACS in 2007 in [place], [place].  Review of his chart indicates his cath at that time demonstrated a thrombotic occlusion of the RCA and he had no disease elsewhere.  He was tx with BMS x 2 to the RCA.  Recent Myoview low risk.  Echocardiogram demonstrated normal LV function.  His occasional chest ache sounds noncardiac.  He can try an antiacid from over-the-counter to see if this helps alleviate his symptoms.  No further cardiac work-up at this time.  2. Thoracic aortic aneurysm without rupture (HCC) 4.3 cm by recent CT.  Follow-up chest MRA is due next February.  We discussed the  importance of not straining and avoiding fluoroquinolone antibiotics.  3. Incidental lung nodule, greater than or equal to 68mm Right lower lung nodule measuring 8 mm on recent CT.  Follow-up chest CT is pending in August 2022.  4. Essential hypertension The patient's blood pressure is controlled on his current regimen.  Continue current therapy.     Dispo:  Return in about 6 months (around 09/08/2021) for Routine Follow Up, w/ Dr. Burt Knack, in person.   Medication Adjustments/Labs and Tests Ordered: Current medicines are reviewed at length with the patient today.  Concerns regarding medicines are outlined above.  Tests Ordered: No orders of the defined types were placed in this encounter.  Medication Changes: No orders of the defined types were placed in this encounter.   Signed, Richardson Dopp, PA-C  03/09/2021 [facility] [address], [place], [place]  [address] Phone: [phone]; Fax: [phone]

## 2021-03-09 ENCOUNTER — Encounter: Payer: Self-pay | Admitting: Physician Assistant

## 2021-03-09 ENCOUNTER — Ambulatory Visit: Payer: Medicare Other | Admitting: Physician Assistant

## 2021-03-09 ENCOUNTER — Other Ambulatory Visit: Payer: Self-pay

## 2021-03-09 VITALS — BP 116/72 | HR 93 | Ht 74.0 in | Wt 274.4 lb

## 2021-03-09 DIAGNOSIS — I2510 Atherosclerotic heart disease of native coronary artery without angina pectoris: Secondary | ICD-10-CM | POA: Diagnosis not present

## 2021-03-09 DIAGNOSIS — I10 Essential (primary) hypertension: Secondary | ICD-10-CM

## 2021-03-09 DIAGNOSIS — I712 Thoracic aortic aneurysm, without rupture, unspecified: Secondary | ICD-10-CM

## 2021-03-09 DIAGNOSIS — R911 Solitary pulmonary nodule: Secondary | ICD-10-CM

## 2021-03-09 DIAGNOSIS — E782 Mixed hyperlipidemia: Secondary | ICD-10-CM

## 2021-03-09 NOTE — Patient Instructions (Signed)
Medication Instructions:  Your physician recommends that you continue on your current medications as directed. Please refer to the Current Medication list given to you today.  *If you need a refill on your cardiac medications before your next appointment, please call your pharmacy*   Lab Work: -None If you have labs (blood work) drawn today and your tests are completely normal, you will receive your results only by: Marland Kitchen MyChart Message (if you have MyChart) OR . A paper copy in the mail If you have any lab test that is abnormal or we need to change your treatment, we will call you to review the results.   Testing/Procedures: Keep upcoming test appointments   Follow-Up: At [facility], you and your health needs are our priority.  As part of our continuing mission to provide you with exceptional heart care, we have created designated Provider Care Teams.  These Care Teams include your primary Cardiologist (physician) and Advanced Practice Providers (APPs -  Physician Assistants and Nurse Practitioners) who all work together to provide you with the care you need, when you need it.  We recommend signing up for the patient portal called "MyChart".  Sign up information is provided on this After Visit Summary.  MyChart is used to connect with patients for Virtual Visits (Telemedicine).  Patients are able to view lab/test results, encounter notes, upcoming appointments, etc.  Non-urgent messages can be sent to your provider as well.   To learn more about what you can do with MyChart, go to [email or web address].    Your next appointment:   6 month(s)  The format for your next appointment:   In Person  Provider:   Sherren Mocha, MD   Other Instructions Your physician wants you to follow-up in: 6 months with Dr. Emelda Fear will receive a reminder letter in the mail two months in advance. If you don't receive a letter, please call our office to schedule the follow-up appointment.

## 2021-05-19 DIAGNOSIS — Z125 Encounter for screening for malignant neoplasm of prostate: Secondary | ICD-10-CM | POA: Diagnosis not present

## 2021-05-19 DIAGNOSIS — H401111 Primary open-angle glaucoma, right eye, mild stage: Secondary | ICD-10-CM | POA: Diagnosis not present

## 2021-05-19 DIAGNOSIS — E291 Testicular hypofunction: Secondary | ICD-10-CM | POA: Diagnosis not present

## 2021-05-19 DIAGNOSIS — Z0000 Encounter for general adult medical examination without abnormal findings: Secondary | ICD-10-CM | POA: Diagnosis not present

## 2021-05-19 DIAGNOSIS — E785 Hyperlipidemia, unspecified: Secondary | ICD-10-CM | POA: Diagnosis not present

## 2021-07-05 ENCOUNTER — Other Ambulatory Visit: Payer: Self-pay

## 2021-07-05 ENCOUNTER — Ambulatory Visit (INDEPENDENT_AMBULATORY_CARE_PROVIDER_SITE_OTHER)
Admission: RE | Admit: 2021-07-05 | Discharge: 2021-07-05 | Disposition: A | Discharge status: Home / Self Care | Payer: Medicare Other | Source: Ambulatory Visit | Attending: Physician Assistant | Admitting: Physician Assistant

## 2021-07-05 DIAGNOSIS — I700 Atherosclerosis of aorta: Secondary | ICD-10-CM | POA: Diagnosis not present

## 2021-07-05 DIAGNOSIS — R911 Solitary pulmonary nodule: Secondary | ICD-10-CM

## 2021-07-05 DIAGNOSIS — R918 Other nonspecific abnormal finding of lung field: Secondary | ICD-10-CM | POA: Diagnosis not present

## 2021-07-06 ENCOUNTER — Encounter: Payer: Self-pay | Admitting: Physician Assistant

## 2021-07-06 ENCOUNTER — Other Ambulatory Visit: Payer: Self-pay | Admitting: *Deleted

## 2021-07-06 DIAGNOSIS — I712 Thoracic aortic aneurysm, without rupture, unspecified: Secondary | ICD-10-CM

## 2021-09-15 ENCOUNTER — Telehealth: Payer: Self-pay | Admitting: Internal Medicine

## 2021-09-15 DIAGNOSIS — G4733 Obstructive sleep apnea (adult) (pediatric): Secondary | ICD-10-CM

## 2021-09-16 NOTE — Telephone Encounter (Addendum)
Call made to patient, confirmed DOB. Made patient aware since he has not been seen since 2018 he will need to wait until his appt. Requesting to be seen sooner if possible.   CY please advise patient wanting to be seen sooner than December. Can we use a held spot for him? If not patient is okay waiting until December.

## 2021-09-16 NOTE — Telephone Encounter (Signed)
LM informing patient order has been sent and to keep his December appt.   Order placed.   Nothing further needed at this time.

## 2021-09-16 NOTE — Telephone Encounter (Signed)
Ok to order his DME - please replace CPAP mask of choice and supplies. Ask Tyler Bradley to keep December appointment so we can keep his chart active.

## 2021-09-21 ENCOUNTER — Telehealth: Payer: Self-pay | Admitting: Internal Medicine

## 2021-09-21 NOTE — Telephone Encounter (Signed)
CY please advise on note from [place] thanks

## 2021-09-21 NOTE — Progress Notes (Signed)
Subjective:    Patient ID: Tyler Bradley, male    DOB: [birth year], 70 y.o.   MRN: [id number]  HPI    male followed for OSA, complicated by CAD/ MI, obesity, history of parotid neoplasm, HBP NPSG- 06/02/1999- AHI 41/ hr  -----------------------------------------------------------------------------------  04/24/17- 70 year old male followed for OSA, complicated by CAD/ MI, HBP, obesity, history of parotid neoplasm CPAP 12/[facility] FOLLOW UP FOR 1 year follow up patient states that he uses his CPAP every night and sleeps all night long . patient states that he sleeps 6 to 7 hours at a time .  Download 100%/4 hour compliance, AHI 1.5/hour confirming good control. He definitely feels he benefits from CPAP.  09/22/21- 70 year old male never smoker followed for OSA, complicated by Lung nodule, CAD/ MI, HTN, CVA, Aortic Atherosclerosis, Hyperlipidemia,  history of parotid neoplasm, Hepatitis, Morbid Obesity,  CPAP 12/[facility] Download- Body weight today- Covid vax- 2 J[employer] Flu vax-  [facility] requires updated face to face office visit before they could refill supplies Patient comes to re-establish. Emphasizes he can't sleep w/o CPAP. Worn out mask and headgear, leaking. Machine is about 72 years old. Discussed replacement and change to autopap, but we can update supplies without waiting for new machine.  Denies other interval health problems, saying heart and breathing are stable.   CT chest 07/05/21-  done by cardiology IMPRESSION: 1. Pulmonary nodule in the inferior right costophrenic recess measures 0.8 cm, unchanged. Additional CT at 18-24 months (from baseline dated 01/19/2021) is considered optional for low-risk patients, but is recommended for high-risk patients. This recommendation follows the consensus statement: Guidelines for Management of Incidental Pulmonary Nodules Detected on CT Images: From the Fleischner Society 2017; Radiology 2017; 284:228-243. 2. Unchanged enlargement of the  tubular ascending thoracic aorta measuring 4.4 x 4.3 cm. Recommend annual imaging followup by CTA or MRA. This recommendation follows 2010 ACCF/AHA/AATS/ACR/ASA/SCA/SCAI/SIR/STS/SVM Guidelines for the Diagnosis and Management of Patients with Thoracic Aortic Disease. Circulation. 2010; 121: F810-F751. Aortic aneurysm NOS (ICD10-I71.9) 3. Coronary artery disease.  Aortic Atherosclerosis (ICD10-I70.0).   ROS-see HPI   + = positive Constitutional:    weight loss, night sweats, fevers, chills, fatigue, lassitude. HEENT:    headaches, difficulty swallowing, tooth/dental problems, sore throat,       sneezing, itching, ear ache, nasal congestion, post nasal drip, snoring CV:    chest pain, orthopnea, PND, swelling in lower extremities, anasarca,                              dizziness, palpitations Resp:   shortness of breath with exertion or at rest.                productive cough,   non-productive cough, coughing up of blood.              change in color of mucus.  wheezing.   Skin:    rash or lesions. GI:  No-   heartburn, indigestion, abdominal pain, nausea, vomiting, diarrhea,                 change in bowel habits, loss of appetite GU: dysuria, change in color of urine, no urgency or frequency.   flank pain. MS:   joint pain, stiffness, decreased range of motion, back pain. Neuro-     nothing unusual Psych:  change in mood or affect.  depression or anxiety.   memory loss.     Objective:  OBJ- Physical Exam General- Alert,  Oriented, Affect-appropriate, Distress- none acute Skin- rash-none, lesions- none, excoriation- none Lymphadenopathy- none Head- atraumatic            Eyes- Gross vision intact, PERRLA, conjunctivae and secretions clear            Ears- Hearing, canals-normal            Nose- Clear, no-Septal dev, mucus, polyps, erosion, perforation             Throat- Mallampati III , mucosa clear , drainage- none, tonsils- atrophic Neck- flexible , trachea midline, no stridor  , thyroid nl, carotid no bruit Chest - symmetrical excursion , unlabored           Heart/CV- RRR , no murmur , no gallop  , no rub, nl s1 s2                           - JVD- none , edema- none, stasis changes- none, varices- none           Lung- clear to P&A, wheeze- none, cough- none , dullness-none, rub- none           Chest wall-  Abd-  Br/ Gen/ Rectal- Not done, not indicated Extrem- cyanosis- none, clubbing, none, atrophy- none, strength- nl Neuro- grossly intact to observation         Assessment & Plan:

## 2021-09-21 NOTE — Telephone Encounter (Signed)
Please see if we can get him in earlier with my held-spot or an app since DME can't fill order without a face-to- face visit.

## 2021-09-21 NOTE — Telephone Encounter (Signed)
This is the response from [facility] with [employer] for the cpap order   Order has been received, however I need face 2 face notes showing usage and benefits to process this order. I have placed this order on hold awaiting this info.   Thank you,   Leroy [place]   The patient hasn't been seen since 2018.

## 2021-09-21 NOTE — Telephone Encounter (Signed)
Spoke with pt and scheduled for tomorrow 09/22/21 at 2:30pm. Pt stated understanding. Nothing further needed at this time.

## 2021-09-22 ENCOUNTER — Encounter: Payer: Self-pay | Admitting: Internal Medicine

## 2021-09-22 ENCOUNTER — Ambulatory Visit (INDEPENDENT_AMBULATORY_CARE_PROVIDER_SITE_OTHER): Payer: Medicare Other | Admitting: Internal Medicine

## 2021-09-22 ENCOUNTER — Other Ambulatory Visit: Payer: Self-pay

## 2021-09-22 VITALS — BP 114/82 | HR 105 | Temp 97.7°F | Ht 74.0 in | Wt 271.0 lb

## 2021-09-22 DIAGNOSIS — G4733 Obstructive sleep apnea (adult) (pediatric): Secondary | ICD-10-CM

## 2021-09-22 NOTE — Patient Instructions (Signed)
Order- DME [facility]- please replace mask of choice, supplies, continue AirView/ card            Also- please replace old CPAP machine, change to autopap 5-15, mask of choice, humidifier, supplies, AirView/ card  Please call if we can help

## 2021-09-22 NOTE — Assessment & Plan Note (Signed)
Benefits from CPAP with good compliance and control. Plan- order updated mask and supplies now. Request replacement of old machine, changing to auto 5-15

## 2021-09-27 ENCOUNTER — Telehealth: Payer: Self-pay | Admitting: Internal Medicine

## 2021-09-27 DIAGNOSIS — G4733 Obstructive sleep apnea (adult) (pediatric): Secondary | ICD-10-CM

## 2021-09-28 NOTE — Telephone Encounter (Signed)
Agree with your suggestion- thanks

## 2021-09-28 NOTE — Telephone Encounter (Signed)
Called and spoke with patient. He is aware that I will place the order for him. Order has been placed.   Nothing further needed at time of call.

## 2021-09-28 NOTE — Telephone Encounter (Signed)
Called and spoke with patient. He was calling to see if we had on file what size mask he has been wearing. He has had his current mask for a while and does not have the package it came in. I reviewed his chart while on the phone with him and did not see any mentioning of a mask type nor size. The mask information is not on airview either.   I advised him that it might be best to see if [employer] would be willing to do a mask fit session with him to determine the correct size. He is interested in this.   Dr. Annamaria Boots, would you be ok with [place] placing an order for a mask refit session with [employer]? Thanks!

## 2021-10-03 DIAGNOSIS — G4733 Obstructive sleep apnea (adult) (pediatric): Secondary | ICD-10-CM | POA: Diagnosis not present

## 2021-10-05 DIAGNOSIS — G4733 Obstructive sleep apnea (adult) (pediatric): Secondary | ICD-10-CM | POA: Diagnosis not present

## 2021-10-08 DIAGNOSIS — G4733 Obstructive sleep apnea (adult) (pediatric): Secondary | ICD-10-CM | POA: Diagnosis not present

## 2021-11-10 ENCOUNTER — Institutional Professional Consult (permissible substitution): Payer: Medicare Other | Admitting: Internal Medicine

## 2021-11-17 DIAGNOSIS — H353131 Nonexudative age-related macular degeneration, bilateral, early dry stage: Secondary | ICD-10-CM | POA: Diagnosis not present

## 2021-11-17 DIAGNOSIS — H25012 Cortical age-related cataract, left eye: Secondary | ICD-10-CM | POA: Diagnosis not present

## 2021-11-17 DIAGNOSIS — H2513 Age-related nuclear cataract, bilateral: Secondary | ICD-10-CM | POA: Diagnosis not present

## 2021-11-17 DIAGNOSIS — H401131 Primary open-angle glaucoma, bilateral, mild stage: Secondary | ICD-10-CM | POA: Diagnosis not present

## 2022-01-06 ENCOUNTER — Telehealth: Payer: Self-pay | Admitting: Cardiovascular Disease

## 2022-01-06 NOTE — Telephone Encounter (Signed)
New Message:    Patient says he is having Cataract surgery on 01-04-22 and having a MRA of the Chest on 01-18-22.  He wants to know if his Cataract surgery will interfere with his test on 01-18-22?

## 2022-01-06 NOTE — Telephone Encounter (Signed)
Called and spoke with patient about his question regarding upcoming cataract surgery and whether or not that would interfere with his upcoming MR Angio procedure on 01/18/22.   Explained to patient that his cataract surgery should not interfere with him having his MR Angio on 02/15. However, he will need to pay attention to his discharge instructions from his cataract surgery to be sure the surgeon doesn't place him on any restrictions following that procedure. Patient verbalized understanding and thanked me for calling.

## 2022-01-17 DIAGNOSIS — H2512 Age-related nuclear cataract, left eye: Secondary | ICD-10-CM | POA: Diagnosis not present

## 2022-01-17 DIAGNOSIS — H25812 Combined forms of age-related cataract, left eye: Secondary | ICD-10-CM | POA: Diagnosis not present

## 2022-01-17 DIAGNOSIS — H52202 Unspecified astigmatism, left eye: Secondary | ICD-10-CM | POA: Diagnosis not present

## 2022-01-17 DIAGNOSIS — H25012 Cortical age-related cataract, left eye: Secondary | ICD-10-CM | POA: Diagnosis not present

## 2022-01-18 ENCOUNTER — Ambulatory Visit (HOSPITAL_COMMUNITY)
Admission: RE | Admit: 2022-01-18 | Discharge: 2022-01-18 | Disposition: A | Discharge status: Home / Self Care | Payer: Medicare Other | Source: Ambulatory Visit | Attending: Physician Assistant | Admitting: Physician Assistant

## 2022-01-18 ENCOUNTER — Other Ambulatory Visit: Payer: Self-pay

## 2022-01-18 DIAGNOSIS — I7121 Aneurysm of the ascending aorta, without rupture: Secondary | ICD-10-CM | POA: Diagnosis not present

## 2022-01-18 DIAGNOSIS — I712 Thoracic aortic aneurysm, without rupture, unspecified: Secondary | ICD-10-CM | POA: Diagnosis not present

## 2022-01-18 MED ORDER — GADOBUTROL 1 MMOL/ML IV SOLN
10.0000 mL | Freq: Once | INTRAVENOUS | Status: AC | PRN
  Administered 2022-01-18: 10 mL via INTRAVENOUS

## 2022-01-19 ENCOUNTER — Encounter: Payer: Self-pay | Admitting: Physician Assistant

## 2022-01-20 ENCOUNTER — Other Ambulatory Visit: Payer: Self-pay | Admitting: *Deleted

## 2022-01-20 DIAGNOSIS — I712 Thoracic aortic aneurysm, without rupture, unspecified: Secondary | ICD-10-CM

## 2022-03-07 DIAGNOSIS — H8110 Benign paroxysmal vertigo, unspecified ear: Secondary | ICD-10-CM | POA: Diagnosis not present

## 2022-03-07 DIAGNOSIS — I10 Essential (primary) hypertension: Secondary | ICD-10-CM | POA: Diagnosis not present

## 2022-03-07 DIAGNOSIS — G4733 Obstructive sleep apnea (adult) (pediatric): Secondary | ICD-10-CM | POA: Diagnosis not present

## 2022-03-07 DIAGNOSIS — E785 Hyperlipidemia, unspecified: Secondary | ICD-10-CM | POA: Diagnosis not present

## 2022-03-10 ENCOUNTER — Other Ambulatory Visit: Payer: Self-pay | Admitting: Physician Assistant

## 2022-03-17 ENCOUNTER — Other Ambulatory Visit: Payer: Self-pay | Admitting: Physician Assistant

## 2022-04-09 ENCOUNTER — Other Ambulatory Visit: Payer: Self-pay | Admitting: Cardiovascular Disease

## 2022-04-20 ENCOUNTER — Other Ambulatory Visit: Payer: Self-pay | Admitting: Cardiovascular Disease

## 2022-04-26 ENCOUNTER — Encounter: Payer: Self-pay | Admitting: Cardiovascular Disease

## 2022-04-26 ENCOUNTER — Ambulatory Visit: Payer: Medicare Other | Admitting: Cardiovascular Disease

## 2022-04-26 VITALS — BP 146/94 | HR 73 | Ht 74.0 in | Wt 281.0 lb

## 2022-04-26 DIAGNOSIS — I10 Essential (primary) hypertension: Secondary | ICD-10-CM | POA: Diagnosis not present

## 2022-04-26 DIAGNOSIS — E782 Mixed hyperlipidemia: Secondary | ICD-10-CM

## 2022-04-26 DIAGNOSIS — I2510 Atherosclerotic heart disease of native coronary artery without angina pectoris: Secondary | ICD-10-CM

## 2022-04-26 DIAGNOSIS — I7121 Aneurysm of the ascending aorta, without rupture: Secondary | ICD-10-CM

## 2022-04-26 MED ORDER — METOPROLOL SUCCINATE ER 25 MG PO TB24: 25.0000 mg | ORAL_TABLET | Freq: Every day | ORAL | 3 refills | Status: DC

## 2022-04-26 NOTE — Patient Instructions (Signed)
Medication Instructions:  INCREASE Metoprolol Succinate to '25mg'$  Daily *If you need a refill on your cardiac medications before your next appointment, please call your pharmacy*   Lab Work: NONE If you have labs (blood work) drawn today and your tests are completely normal, you will receive your results only by: [place] (if you have MyChart) OR A paper copy in the mail If you have any lab test that is abnormal or we need to change your treatment, we will call you to review the results.   Testing/Procedures: NONE   Follow-Up: At [facility], you and your health needs are our priority.  As part of our continuing mission to provide you with exceptional heart care, we have created designated Provider Care Teams.  These Care Teams include your primary Cardiologist (physician) and Advanced Practice Providers (APPs -  Physician Assistants and Nurse Practitioners) who all work together to provide you with the care you need, when you need it.  Your next appointment:   1 year(s)  The format for your next appointment:   In Person  Provider:   Sherren Mocha, MD        Important Information About Sugar

## 2022-04-26 NOTE — Progress Notes (Signed)
Cardiology Office Note:    Date:  04/26/2022   ID:  Tyler Bradley, DOB Aug 04, 1951, MRN [id number]  PCP:  Donnajean Lopes, MD   [facility] Providers Cardiologist:  Sherren Mocha, MD     Referring MD: Donnajean Lopes, MD   Chief Complaint  Patient presents with   Hypertension    History of Present Illness:    Tyler Bradley is a 71 y.o. male with a hx of: Coronary artery disease  S/p ACS in 2007 >> PCI:  BMS x 2 to RCA (overlapping) [place], [place] ETT in 2017: no ST changes, normal ex tol Myoview 9/19: low risk  Myoview 1/22: low risk  Ascending thoracic aortic aneurysm  CT 2/22: 4.3 cm>> repeat 1 year Pulmonary nodule CT 2/22: Right lung base 8 mm>> repeat CT 6 months Hypertension  Hyperlipidemia  OSA Hx of CVA   The patient is here alone today.  He has become less active over recent years and relates some of this to back and leg problems.  He has more fatigue and some problems with balance.  He describes numbness into his upper legs but not occurring in his feet.  No chest pain, shortness of breath, edema, heart palpitations, orthopnea, or PND.  He is compliant with his medications.  He has been trying to eat better and has lost about 7 pounds over the last few weeks.  He is trying to limit his eating from 12 noon until 8 PM in a pattern of intermittent fasting.    Past Medical History:  Diagnosis Date   Aortic atherosclerosis ([place])    Noted on CT in 01/2021   Arthritis    OA    CAD (coronary artery disease)    S/p ACS in 2007 >> PCI:  BMS x 2 to RCA (overlapping) - [place], [place] // ETT in 2017: no ST changes, normal ex tol // Myoview 9/19: EF 58, fixed inf defect w normal wall motion (probable diaph atten), no ischemia; low risk // Myoview 1/22: EF 52, inf infarct, no ischemia; low risk    Cancer (HCC)    BASAL CELL CARCINOMA   Fatigue    Heart attack ([place])    2007 DR. Burt Knack    Hepatitis    History of rheumatic fever    HTN  (hypertension)    Hx of CVA    04/2012   Hyperlipemia    Lung nodule    Chest CTA 2/22: 8 mm RLL nodule // Chest CT 8/22: Right lung base nodule unchanged at 0.8 cm; thoracic aortic aneurysm 4.4 x 4.3 cm.   OSA (obstructive sleep apnea)    CPAP    Thoracic aortic aneurysm (HCC)    Chest CTA 2/22: Asc thoracic Ao aneurysm 4.3 cm; 8 mm RLL nodule; cholelithiasis; aortic atherosclerosis // Echo 2/22: EF 55-60, no RWMA, mild LVH, GR 1 DD, normal RVSF, dilated aortic root (44 mm), dilated ascending aorta (43 mm) // Chest MRA 2/23: Asc Aorta 43 mm    Past Surgical History:  Procedure Laterality Date   BASAL CELL CARCINOMA EXCISION     from forehead   CORONARY ANGIOPLASTY WITH STENT PLACEMENT  2007   PAROTIDECTOMY Left 09/04/2014   Procedure: PAROTIDECTOMY;  Surgeon: Melida Quitter, MD;  Location: [place];  Service: ENT;  Laterality: Left;   SEPTOPLASTY  1984   TOTAL HIP ARTHROPLASTY Right 11/2011   TOTAL HIP ARTHROPLASTY Left 01/9573   UMBILICAL HERNIA REPAIR  1998    Current  Medications: Current Meds  Medication Sig   aspirin 81 MG tablet Take 1 tablet (81 mg total) by mouth daily.   B COMPLEX-C PO Take 1 tablet by mouth daily.   Cholecalciferol (VITAMIN D3) 5000 UNITS CAPS Take 5,000 Units by mouth daily.   Coenzyme Q10 300 MG CAPS Take 300 mg by mouth 2 (two) times daily.   ibuprofen (ADVIL,MOTRIN) 200 MG tablet Take 200 mg by mouth every 6 (six) hours as needed for mild pain.   l-methylfolate-B6-B12 (METANX) 3-35-2 MG TABS tablet Take 1 capsule by mouth daily.   latanoprost (XALATAN) 0.005 % ophthalmic solution Place 1 drop into both eyes at bedtime.   metoprolol succinate (TOPROL XL) 25 MG 24 hr tablet Take 1 tablet (25 mg total) by mouth daily.   Omega-3 Fatty Acids (FISH OIL) 1000 MG CAPS Take 1 capsule by mouth daily.   OMEGA-3 KRILL OIL PO Take 1 capsule by mouth daily.   rosuvastatin (CRESTOR) 10 MG tablet Take 1 tablet (10 mg total) by mouth 3 (three) times a week.    triamterene-hydrochlorothiazide (DYAZIDE) 37.5-25 MG capsule TAKE ONE CAPSULE BY MOUTH ONE TIME DAILY   vitamin E 400 UNIT capsule Take 400 Units by mouth 2 (two) times daily.   [DISCONTINUED] metoprolol succinate (TOPROL-XL) 25 MG 24 hr tablet Take 0.5 tablets (12.5 mg total) by mouth daily. Please call our office to schedule an overdue appointment with Dr. Burt Knack before anymore refills. [phone]. Thank you 2nd attempt     Allergies:   Latex, Sulfa antibiotics, Sulfonamide derivatives, and Tape   Social History   Socioeconomic History   Marital status: Married    Spouse name: Not on file   Number of children: 2   Years of education: Not on file   Highest education level: Not on file  Occupational History    Comment: Chief Operating Officer  Tobacco Use   Smoking status: Never   Smokeless tobacco: Never  Substance and Sexual Activity   Alcohol use: Yes   Drug use: No   Sexual activity: Not on file  Other Topics Concern   Not on file  Social History Narrative   Not on file   Social Determinants of Health   Financial Resource Strain: Not on file  Food Insecurity: Not on file  Transportation Needs: Not on file  Physical Activity: Not on file  Stress: Not on file  Social Connections: Not on file     Family History: The patient's family history includes Breast cancer in his mother; Diabetes in his mother; Hodgkin's lymphoma in his father; Other in his paternal grandmother.  ROS:   Please see the history of present illness.    All other systems reviewed and are negative.  EKGs/Labs/Other Studies Reviewed:    The following studies were reviewed today: Echo 01/14/2021:  1. Left ventricular ejection fraction, by estimation, is 55 to 60%. The  left ventricle has normal function. The left ventricle has no regional  wall motion abnormalities. There is mild left ventricular hypertrophy of  the basal-septal segment. Left  ventricular diastolic parameters are consistent with  Grade I diastolic  dysfunction (impaired relaxation).   2. Right ventricular systolic function is normal. The right ventricular  size is normal. Tricuspid regurgitation signal is inadequate for assessing  PA pressure.   3. The mitral valve is normal in structure. No evidence of mitral valve  regurgitation. No evidence of mitral stenosis.   4. The aortic valve is normal in structure. Aortic valve regurgitation is  not visualized. No aortic stenosis is present.   5. Aortic dilatation noted. There is mild to moderate dilatation of the  aortic root, measuring 44 mm. There is mild dilatation of the ascending  aorta, measuring 43 mm.   Myocardial Perfusion Scan 12/29/2020: There was no ST segment deviation noted during stress. The left ventricular ejection fraction is mildly decreased (45-54%). Nuclear stress EF: 52%. Defect 1: There is a small defect of mild severity present in the basal inferior, mid inferior and apical inferior location. Findings consistent with prior myocardial infarction. This is a low risk study.   1. Fixed inferior perfusion defect with hypokinesis in this region, consistent with prior infarct 2. Mild systolic dysfunction (EF 00%). 2. Low risk study.  No ischemia.  Chest MRA 01/18/2022: IMPRESSION: 1. Stable mild uncomplicated fusiform aneurysmal dilatation of the ascending thoracic aorta measuring 43 mm in diameter, unchanged compared to the 09/2012 examination by my direct remeasurement. Aortic aneurysm NOS (ICD10-I71.9). 2. Borderline cardiomegaly. 3. Small hiatal hernia.  EKG:  EKG is ordered today.  The ekg ordered today demonstrates NSR 73 bpm, moderate criteria for LVH may be normal variant  Recent Labs: No results found for requested labs within last 8760 hours.  Recent Lipid Panel    Component Value Date/Time   CHOL 113 03/16/2015 0919   TRIG 84.0 03/16/2015 0919   HDL 40.60 03/16/2015 0919   CHOLHDL 3 03/16/2015 0919   VLDL 16.8 03/16/2015 0919    LDLCALC 56 03/16/2015 0919     Risk Assessment/Calculations:           Physical Exam:    VS:  BP (!) 146/94 Comment: Repeat BP 128/80  Pulse 73   Ht '6\' 2"'$  (1.88 m)   Wt 281 lb (127.5 kg)   SpO2 95%   BMI 36.08 kg/m     Wt Readings from Last 3 Encounters:  04/26/22 281 lb (127.5 kg)  09/22/21 271 lb (122.9 kg)  03/09/21 274 lb 6.4 oz (124.5 kg)     GEN:  Well nourished, well developed in no acute distress HEENT: Normal NECK: No JVD; No carotid bruits LYMPHATICS: No lymphadenopathy CARDIAC: RRR, no murmurs, rubs, gallops RESPIRATORY:  Clear to auscultation without rales, wheezing or rhonchi  ABDOMEN: Soft, non-tender, non-distended MUSCULOSKELETAL:  No edema; No deformity  SKIN: Warm and dry NEUROLOGIC:  Alert and oriented x 3 PSYCHIATRIC:  Normal affect   ASSESSMENT:    1. Coronary artery disease involving native coronary artery of native heart without angina pectoris   2. Aneurysm of ascending aorta without rupture ([place])   3. Essential hypertension   4. Mixed hyperlipidemia    PLAN:    In order of problems listed above:  The patient is stable with no symptoms of angina.  He remains on aspirin for antiplatelet therapy, a beta-blocker, and a statin drug. The patient's most recent chest MRA from a few months ago is reviewed and shows stable aneurysmal dilatation of the ascending aorta with a maximum diameter 4.3 cm unchanged from previous imaging studies.  We reviewed the importance of blood pressure control, use of a beta-blocker, and continued surveillance.  I would anticipate a repeat imaging study in about 2 years. Blood pressure is controlled at home.  He reports home readings averaging 130/72.  States that he very rarely has readings in excess of 140 mmHg.  He will continue on triamterene/hydrochlorothiazide, and metoprolol succinate.  I have asked him to increase his metoprolol succinate from 12.5 to 25 mg daily. Treated  with a statin drug, followed by his  PCP, most recent LDL on file was 80 mg/dL.  HDL is 51 mg/dL.  He will work on lifestyle modification as discussed above.  Continue rosuvastatin.      Medication Adjustments/Labs and Tests Ordered: Current medicines are reviewed at length with the patient today.  Concerns regarding medicines are outlined above.  Orders Placed This Encounter  Procedures   EKG 12-Lead   Meds ordered this encounter  Medications   metoprolol succinate (TOPROL XL) 25 MG 24 hr tablet    Sig: Take 1 tablet (25 mg total) by mouth daily.    Dispense:  90 tablet    Refill:  3    Dose INCREASE    Patient Instructions  Medication Instructions:  INCREASE Metoprolol Succinate to '25mg'$  Daily *If you need a refill on your cardiac medications before your next appointment, please call your pharmacy*   Lab Work: NONE If you have labs (blood work) drawn today and your tests are completely normal, you will receive your results only by: [place] (if you have MyChart) OR A paper copy in the mail If you have any lab test that is abnormal or we need to change your treatment, we will call you to review the results.   Testing/Procedures: NONE   Follow-Up: At [facility], you and your health needs are our priority.  As part of our continuing mission to provide you with exceptional heart care, we have created designated Provider Care Teams.  These Care Teams include your primary Cardiologist (physician) and Advanced Practice Providers (APPs -  Physician Assistants and Nurse Practitioners) who all work together to provide you with the care you need, when you need it.  Your next appointment:   1 year(s)  The format for your next appointment:   In Person  Provider:   Sherren Mocha, MD        Important Information About Sugar         Signed, Sherren Mocha, MD  04/26/2022 5:18 PM    [facility]

## 2022-05-06 ENCOUNTER — Other Ambulatory Visit: Payer: Self-pay | Admitting: Cardiovascular Disease

## 2022-07-06 DIAGNOSIS — E785 Hyperlipidemia, unspecified: Secondary | ICD-10-CM | POA: Diagnosis not present

## 2022-07-06 DIAGNOSIS — I10 Essential (primary) hypertension: Secondary | ICD-10-CM | POA: Diagnosis not present

## 2022-07-06 DIAGNOSIS — E291 Testicular hypofunction: Secondary | ICD-10-CM | POA: Diagnosis not present

## 2022-07-06 DIAGNOSIS — Z0000 Encounter for general adult medical examination without abnormal findings: Secondary | ICD-10-CM | POA: Diagnosis not present

## 2022-07-06 DIAGNOSIS — Z125 Encounter for screening for malignant neoplasm of prostate: Secondary | ICD-10-CM | POA: Diagnosis not present

## 2022-07-06 DIAGNOSIS — R5383 Other fatigue: Secondary | ICD-10-CM | POA: Diagnosis not present

## 2022-07-10 DIAGNOSIS — I10 Essential (primary) hypertension: Secondary | ICD-10-CM | POA: Diagnosis not present

## 2022-07-10 DIAGNOSIS — E785 Hyperlipidemia, unspecified: Secondary | ICD-10-CM | POA: Diagnosis not present

## 2022-07-10 DIAGNOSIS — I2510 Atherosclerotic heart disease of native coronary artery without angina pectoris: Secondary | ICD-10-CM | POA: Diagnosis not present

## 2022-07-10 DIAGNOSIS — R82998 Other abnormal findings in urine: Secondary | ICD-10-CM | POA: Diagnosis not present

## 2022-07-10 DIAGNOSIS — Z0000 Encounter for general adult medical examination without abnormal findings: Secondary | ICD-10-CM | POA: Diagnosis not present

## 2022-07-13 DIAGNOSIS — S299XXA Unspecified injury of thorax, initial encounter: Secondary | ICD-10-CM | POA: Diagnosis not present

## 2022-08-31 DIAGNOSIS — H353222 Exudative age-related macular degeneration, left eye, with inactive choroidal neovascularization: Secondary | ICD-10-CM | POA: Diagnosis not present

## 2022-08-31 DIAGNOSIS — H401131 Primary open-angle glaucoma, bilateral, mild stage: Secondary | ICD-10-CM | POA: Diagnosis not present

## 2022-09-07 ENCOUNTER — Telehealth: Payer: Self-pay | Admitting: *Deleted

## 2022-09-07 DIAGNOSIS — H353113 Nonexudative age-related macular degeneration, right eye, advanced atrophic without subfoveal involvement: Secondary | ICD-10-CM | POA: Diagnosis not present

## 2022-09-07 DIAGNOSIS — H43822 Vitreomacular adhesion, left eye: Secondary | ICD-10-CM | POA: Diagnosis not present

## 2022-09-07 DIAGNOSIS — H353221 Exudative age-related macular degeneration, left eye, with active choroidal neovascularization: Secondary | ICD-10-CM | POA: Diagnosis not present

## 2022-09-07 DIAGNOSIS — H43811 Vitreous degeneration, right eye: Secondary | ICD-10-CM | POA: Diagnosis not present

## 2022-09-07 NOTE — Telephone Encounter (Signed)
Pt returned my call and he confirms he is taking Rosuvastatin.  He states Insurance may think he's taking it everyday but Dr. Burt Knack changed it to 3 times a week and his refills are lasting longer.

## 2022-09-07 NOTE — Telephone Encounter (Signed)
Call placed to pt regarding message from [facility], PA-C, below:  Communication received from insurance co indicates patient may not be taking Rosuvastatin.  Please contact patient to see if he is taking Rosuvastatin. If he has stopped due to side effects, offer referral to PharmD for consideration of [id number] inhib  Left a message for pt to call back.

## 2022-09-21 ENCOUNTER — Ambulatory Visit: Payer: Medicare Other | Admitting: Internal Medicine

## 2022-10-05 DIAGNOSIS — H43811 Vitreous degeneration, right eye: Secondary | ICD-10-CM | POA: Diagnosis not present

## 2022-10-05 DIAGNOSIS — H353113 Nonexudative age-related macular degeneration, right eye, advanced atrophic without subfoveal involvement: Secondary | ICD-10-CM | POA: Diagnosis not present

## 2022-10-05 DIAGNOSIS — H353221 Exudative age-related macular degeneration, left eye, with active choroidal neovascularization: Secondary | ICD-10-CM | POA: Diagnosis not present

## 2022-10-08 NOTE — Progress Notes (Unsigned)
Subjective:    Patient ID: Tyler Bradley, male    DOB: [birth year], 71 y.o.   MRN: [id number]  HPI    male followed for OSA, complicated by CAD/ MI, obesity, history of parotid neoplasm, HBP NPSG- 06/02/1999- AHI 41/ hr  -----------------------------------------------------------------------------------   09/22/21- 71 year old male never smoker followed for OSA, complicated by Lung nodule, CAD/ MI, HTN, CVA, Aortic Atherosclerosis, Hyperlipidemia,  history of parotid neoplasm, Hepatitis, Morbid Obesity,  CPAP 12/[employer] Download- Body weight today- Covid vax- 2 J&J Flu vax-  [employer] requires updated face to face office visit before they could refill supplies Patient comes to re-establish. Emphasizes he can't sleep w/o CPAP. Worn out mask and headgear, leaking. Machine is about 73 years old. Discussed replacement and change to autopap, but we can update supplies without waiting for new machine.  Denies other interval health problems, saying heart and breathing are stable.  CT chest 07/05/21-  done by cardiology IMPRESSION: 1. Pulmonary nodule in the inferior right costophrenic recess measures 0.8 cm, unchanged. Additional CT at 18-24 months (from baseline dated 01/19/2021) is considered optional for low-risk patients, but is recommended for high-risk patients. This recommendation follows the consensus statement: Guidelines for Management of Incidental Pulmonary Nodules Detected on CT Images: From the Fleischner Society 2017; Radiology 2017; 284:228-243. 2. Unchanged enlargement of the tubular ascending thoracic aorta measuring 4.4 x 4.3 cm. Recommend annual imaging followup by CTA or MRA. This recommendation follows 2010 ACCF/AHA/AATS/ACR/ASA/SCA/SCAI/SIR/STS/SVM Guidelines for the Diagnosis and Management of Patients with Thoracic Aortic Disease. Circulation. 2010; 121: O115-B262. Aortic aneurysm NOS (ICD10-I71.9) 3. Coronary artery disease.  Aortic Atherosclerosis  (ICD10-I70.0).  10/10/22- 71 year old male never smoker followed for OSA, complicated by Lung nodule, CAD/ MI, HTN, CVA, Aortic Atherosclerosis, Hyperlipidemia,  history of parotid neoplasm, Hepatitis, Morbid Obesity,  CPAP 12/[employer] Download compliance- Body weight today- Covid vax- 2 J&J Flu vax-    ROS-see HPI   + = positive Constitutional:    weight loss, night sweats, fevers, chills, fatigue, lassitude. HEENT:    headaches, difficulty swallowing, tooth/dental problems, sore throat,       sneezing, itching, ear ache, nasal congestion, post nasal drip, snoring CV:    chest pain, orthopnea, PND, swelling in lower extremities, anasarca,                               dizziness, palpitations Resp:   shortness of breath with exertion or at rest.                productive cough,   non-productive cough, coughing up of blood.              change in color of mucus.  wheezing.   Skin:    rash or lesions. GI:  No-   heartburn, indigestion, abdominal pain, nausea, vomiting, diarrhea,                 change in bowel habits, loss of appetite GU: dysuria, change in color of urine, no urgency or frequency.   flank pain. MS:   joint pain, stiffness, decreased range of motion, back pain. Neuro-     nothing unusual Psych:  change in mood or affect.  depression or anxiety.   memory loss.     Objective:  OBJ- Physical Exam General- Alert, Oriented, Affect-appropriate, Distress- none acute Skin- rash-none, lesions- none, excoriation- none Lymphadenopathy- none Head- atraumatic            Eyes-  Gross vision intact, PERRLA, conjunctivae and secretions clear            Ears- Hearing, canals-normal            Nose- Clear, no-Septal dev, mucus, polyps, erosion, perforation             Throat- Mallampati III , mucosa clear , drainage- none, tonsils- atrophic Neck- flexible , trachea midline, no stridor , thyroid nl, carotid no bruit Chest - symmetrical excursion , unlabored           Heart/CV- RRR , no  murmur , no gallop  , no rub, nl s1 s2                           - JVD- none , edema- none, stasis changes- none, varices- none           Lung- clear to P&A, wheeze- none, cough- none , dullness-none, rub- none           Chest wall-  Abd-  Br/ Gen/ Rectal- Not done, not indicated Extrem- cyanosis- none, clubbing, none, atrophy- none, strength- nl Neuro- grossly intact to observation         Assessment & Plan:

## 2022-10-10 ENCOUNTER — Encounter: Payer: Self-pay | Admitting: Internal Medicine

## 2022-10-10 ENCOUNTER — Ambulatory Visit: Payer: Medicare Other | Admitting: Internal Medicine

## 2022-10-10 VITALS — BP 136/76 | HR 91 | Ht 74.0 in | Wt 279.8 lb

## 2022-10-10 DIAGNOSIS — G4733 Obstructive sleep apnea (adult) (pediatric): Secondary | ICD-10-CM | POA: Diagnosis not present

## 2022-10-10 DIAGNOSIS — R911 Solitary pulmonary nodule: Secondary | ICD-10-CM

## 2022-10-10 NOTE — Patient Instructions (Signed)
Order- DME [employer]- please install AirView or SD card in CPAP machine.   Please call if we can help

## 2022-10-11 ENCOUNTER — Encounter: Payer: Self-pay | Admitting: Internal Medicine

## 2022-10-11 NOTE — Assessment & Plan Note (Signed)
He is happy and consistent with his old CPAP machine for now, saying it works very well and he is sleeping well.  He has his new machine available. Plan-continue CPAP 12 with current machine.  Switch to new machine when he feels ready.

## 2022-10-11 NOTE — Assessment & Plan Note (Signed)
He reports that his primary physician continues to follow this and orders appropriate CT scans.  We will help if needed.

## 2022-11-08 DIAGNOSIS — H353112 Nonexudative age-related macular degeneration, right eye, intermediate dry stage: Secondary | ICD-10-CM | POA: Diagnosis not present

## 2022-11-08 DIAGNOSIS — H353221 Exudative age-related macular degeneration, left eye, with active choroidal neovascularization: Secondary | ICD-10-CM | POA: Diagnosis not present

## 2022-11-08 DIAGNOSIS — H43821 Vitreomacular adhesion, right eye: Secondary | ICD-10-CM | POA: Diagnosis not present

## 2022-11-09 DIAGNOSIS — G609 Hereditary and idiopathic neuropathy, unspecified: Secondary | ICD-10-CM | POA: Diagnosis not present

## 2022-11-09 DIAGNOSIS — M5416 Radiculopathy, lumbar region: Secondary | ICD-10-CM | POA: Diagnosis not present

## 2022-11-09 DIAGNOSIS — R270 Ataxia, unspecified: Secondary | ICD-10-CM | POA: Diagnosis not present

## 2022-11-13 DIAGNOSIS — R270 Ataxia, unspecified: Secondary | ICD-10-CM | POA: Diagnosis not present

## 2022-11-13 DIAGNOSIS — R29818 Other symptoms and signs involving the nervous system: Secondary | ICD-10-CM | POA: Diagnosis not present

## 2022-12-06 DIAGNOSIS — H353221 Exudative age-related macular degeneration, left eye, with active choroidal neovascularization: Secondary | ICD-10-CM | POA: Diagnosis not present

## 2023-01-05 DIAGNOSIS — H903 Sensorineural hearing loss, bilateral: Secondary | ICD-10-CM | POA: Diagnosis not present

## 2023-01-17 DIAGNOSIS — H43821 Vitreomacular adhesion, right eye: Secondary | ICD-10-CM | POA: Diagnosis not present

## 2023-01-17 DIAGNOSIS — H43811 Vitreous degeneration, right eye: Secondary | ICD-10-CM | POA: Diagnosis not present

## 2023-01-17 DIAGNOSIS — H353221 Exudative age-related macular degeneration, left eye, with active choroidal neovascularization: Secondary | ICD-10-CM | POA: Diagnosis not present

## 2023-01-17 DIAGNOSIS — H353113 Nonexudative age-related macular degeneration, right eye, advanced atrophic without subfoveal involvement: Secondary | ICD-10-CM | POA: Diagnosis not present

## 2023-01-18 ENCOUNTER — Ambulatory Visit (HOSPITAL_COMMUNITY)
Admission: RE | Admit: 2023-01-18 | Discharge: 2023-01-18 | Disposition: A | Discharge status: Home / Self Care | Payer: Medicare Other | Source: Ambulatory Visit | Attending: Physician Assistant | Admitting: Physician Assistant

## 2023-01-18 DIAGNOSIS — I712 Thoracic aortic aneurysm, without rupture, unspecified: Secondary | ICD-10-CM | POA: Insufficient documentation

## 2023-01-18 DIAGNOSIS — I7121 Aneurysm of the ascending aorta, without rupture: Secondary | ICD-10-CM | POA: Diagnosis not present

## 2023-01-18 MED ORDER — IOHEXOL 350 MG/ML SOLN
75.0000 mL | Freq: Once | INTRAVENOUS | Status: AC | PRN
  Administered 2023-01-18: 75 mL via INTRAVENOUS

## 2023-01-19 ENCOUNTER — Telehealth: Payer: Self-pay | Admitting: *Deleted

## 2023-01-19 DIAGNOSIS — I7121 Aneurysm of the ascending aorta, without rupture: Secondary | ICD-10-CM

## 2023-01-19 NOTE — Telephone Encounter (Signed)
-----   Message from Tyler Bradley, [place] sent at 01/18/2023  4:09 PM EST ----- Results sent to Tyler Bradley via [employer]. See MyChart comment. I will send a copy to his PCP as FYI. Please arrange repeat gated chest/aorta CTA (dx: ascending thoracic aortic aneurysm)  Tyler Bradley  Your CT shows stable size of the thoracic aortic aneurysm at 4.3 cm. This is unchanged. Continue current medications/treatment plan and follow up as scheduled. We will repeat this again in 1 year. The right lung nodule has not changed over 2 years and is considered benign.  Richardson Dopp, PA-C    01/18/2023 4:05 PM

## 2023-02-28 DIAGNOSIS — H43811 Vitreous degeneration, right eye: Secondary | ICD-10-CM | POA: Diagnosis not present

## 2023-02-28 DIAGNOSIS — H43821 Vitreomacular adhesion, right eye: Secondary | ICD-10-CM | POA: Diagnosis not present

## 2023-02-28 DIAGNOSIS — H353113 Nonexudative age-related macular degeneration, right eye, advanced atrophic without subfoveal involvement: Secondary | ICD-10-CM | POA: Diagnosis not present

## 2023-02-28 DIAGNOSIS — H353221 Exudative age-related macular degeneration, left eye, with active choroidal neovascularization: Secondary | ICD-10-CM | POA: Diagnosis not present

## 2023-03-07 DIAGNOSIS — H353221 Exudative age-related macular degeneration, left eye, with active choroidal neovascularization: Secondary | ICD-10-CM | POA: Diagnosis not present

## 2023-03-07 DIAGNOSIS — H401131 Primary open-angle glaucoma, bilateral, mild stage: Secondary | ICD-10-CM | POA: Diagnosis not present

## 2023-03-15 DIAGNOSIS — M5416 Radiculopathy, lumbar region: Secondary | ICD-10-CM | POA: Diagnosis not present

## 2023-03-15 DIAGNOSIS — R270 Ataxia, unspecified: Secondary | ICD-10-CM | POA: Diagnosis not present

## 2023-03-15 DIAGNOSIS — G609 Hereditary and idiopathic neuropathy, unspecified: Secondary | ICD-10-CM | POA: Diagnosis not present

## 2023-04-06 DIAGNOSIS — H43811 Vitreous degeneration, right eye: Secondary | ICD-10-CM | POA: Diagnosis not present

## 2023-04-06 DIAGNOSIS — H353221 Exudative age-related macular degeneration, left eye, with active choroidal neovascularization: Secondary | ICD-10-CM | POA: Diagnosis not present

## 2023-04-06 DIAGNOSIS — H43821 Vitreomacular adhesion, right eye: Secondary | ICD-10-CM | POA: Diagnosis not present

## 2023-04-06 DIAGNOSIS — H353113 Nonexudative age-related macular degeneration, right eye, advanced atrophic without subfoveal involvement: Secondary | ICD-10-CM | POA: Diagnosis not present

## 2023-04-24 ENCOUNTER — Other Ambulatory Visit: Payer: Self-pay | Admitting: Cardiovascular Disease

## 2023-05-24 DIAGNOSIS — H43821 Vitreomacular adhesion, right eye: Secondary | ICD-10-CM | POA: Diagnosis not present

## 2023-05-24 DIAGNOSIS — H43811 Vitreous degeneration, right eye: Secondary | ICD-10-CM | POA: Diagnosis not present

## 2023-05-24 DIAGNOSIS — H353231 Exudative age-related macular degeneration, bilateral, with active choroidal neovascularization: Secondary | ICD-10-CM | POA: Diagnosis not present

## 2023-06-21 DIAGNOSIS — H353211 Exudative age-related macular degeneration, right eye, with active choroidal neovascularization: Secondary | ICD-10-CM | POA: Diagnosis not present

## 2023-07-26 DIAGNOSIS — H353231 Exudative age-related macular degeneration, bilateral, with active choroidal neovascularization: Secondary | ICD-10-CM | POA: Diagnosis not present

## 2023-07-26 DIAGNOSIS — H353221 Exudative age-related macular degeneration, left eye, with active choroidal neovascularization: Secondary | ICD-10-CM | POA: Diagnosis not present

## 2023-07-26 DIAGNOSIS — H43811 Vitreous degeneration, right eye: Secondary | ICD-10-CM | POA: Diagnosis not present

## 2023-07-26 DIAGNOSIS — H353211 Exudative age-related macular degeneration, right eye, with active choroidal neovascularization: Secondary | ICD-10-CM | POA: Diagnosis not present

## 2023-07-26 DIAGNOSIS — H43821 Vitreomacular adhesion, right eye: Secondary | ICD-10-CM | POA: Diagnosis not present

## 2023-07-28 ENCOUNTER — Other Ambulatory Visit: Payer: Self-pay | Admitting: Cardiovascular Disease

## 2023-07-31 ENCOUNTER — Encounter: Payer: Self-pay | Admitting: Cardiovascular Disease

## 2023-07-31 ENCOUNTER — Ambulatory Visit: Payer: Medicare Other | Admitting: Cardiovascular Disease

## 2023-07-31 VITALS — BP 130/86 | HR 83 | Ht 74.0 in | Wt 265.2 lb

## 2023-07-31 DIAGNOSIS — E782 Mixed hyperlipidemia: Secondary | ICD-10-CM

## 2023-07-31 DIAGNOSIS — I2510 Atherosclerotic heart disease of native coronary artery without angina pectoris: Secondary | ICD-10-CM | POA: Diagnosis not present

## 2023-07-31 DIAGNOSIS — I700 Atherosclerosis of aorta: Secondary | ICD-10-CM

## 2023-07-31 DIAGNOSIS — I7121 Aneurysm of the ascending aorta, without rupture: Secondary | ICD-10-CM

## 2023-07-31 DIAGNOSIS — I10 Essential (primary) hypertension: Secondary | ICD-10-CM | POA: Diagnosis not present

## 2023-07-31 NOTE — Progress Notes (Signed)
Cardiology Office Note:    Date:  07/31/2023   ID:  Tyler Bradley, DOB 1951/10/26, MRN [id number]  PCP:  Garlan Fillers, MD   [facility] Providers Cardiologist:  Tonny Bollman, MD     Referring MD: Garlan Fillers, MD   Chief Complaint  Patient presents with   Coronary Artery Disease    History of Present Illness:    Tyler Bradley is a 72 y.o. male with a hx of:  Coronary artery disease  S/p ACS in 2007 >> PCI:  BMS x 2 to RCA (overlapping) [place], [place] ETT in 2017: no ST changes, normal ex tol Myoview 9/19: low risk  Myoview 1/22: low risk  Ascending thoracic aortic aneurysm  CT 2/22: 4.3 cm>> repeat 1 year Pulmonary nodule CT 2/22: Right lung base 8 mm>> repeat CT 6 months Hypertension  Hyperlipidemia  OSA Hx of CVA   The patient was last seen here in May 2023.  At that time he reported functional limitation related to back and leg problems.  He had undergone echo and nuclear stress testing studies in 2022.  His echocardiogram demonstrated normal LVEF of 55 to 60% with no regional wall motion abnormalities, normal RV function, no significant valvular disease, and moderate dilatation of the aortic root up to 44 mm.  A myocardial perfusion scan showed a nuclear stress LVEF gated at 52% with a small defect in the inferior wall consistent with prior infarction but no ischemia demonstrated.  The patient is here alone today. His wife of 54 years just passed away last week from metastatic breast cancer. The patient's primary complaint is poor balance, leg neuropathy, and difficulty with 'getting around.' Today, he denies symptoms of chest pain, shortness of breath, orthopnea, PND, lower extremity edema, dizziness, or syncope. Sometimes he wakes up with a heart pounding/fluttering sensation. This resolves on it's own. He has OSA and wears CPAP every night.   Past Medical History:  Diagnosis Date   Aortic atherosclerosis (HCC)    Noted on CT  in 01/2021   Arthritis    OA    CAD (coronary artery disease)    S/p ACS in 2007 >> PCI:  BMS x 2 to RCA (overlapping) - [place], [place] // ETT in 2017: no ST changes, normal ex tol // Myoview 9/19: EF 58, fixed inf defect w normal wall motion (probable diaph atten), no ischemia; low risk // Myoview 1/22: EF 52, inf infarct, no ischemia; low risk    Cancer (HCC)    BASAL CELL CARCINOMA   Fatigue    Heart attack (HCC)    2007 DR. Excell Seltzer    Hepatitis    History of rheumatic fever    HTN (hypertension)    Hx of CVA    04/2012   Hyperlipemia    Lung nodule    Chest CTA 2/22: 8 mm RLL nodule // Chest CT 8/22: Right lung base nodule unchanged at 0.8 cm; thoracic aortic aneurysm 4.4 x 4.3 cm.   OSA (obstructive sleep apnea)    CPAP    Thoracic aortic aneurysm (HCC)    Chest CTA 2/22: Asc thoracic Ao aneurysm 4.3 cm; 8 mm RLL nodule; cholelithiasis; aortic atherosclerosis // Echo 2/22: EF 55-60, no RWMA, mild LVH, GR 1 DD, normal RVSF, dilated aortic root (44 mm), dilated ascending aorta (43 mm) // Chest MRA 2/23: Asc Aorta 43 mm    Past Surgical History:  Procedure Laterality Date   BASAL CELL CARCINOMA EXCISION  from forehead   CORONARY ANGIOPLASTY WITH STENT PLACEMENT  2007   PAROTIDECTOMY Left 09/04/2014   Procedure: PAROTIDECTOMY;  Surgeon: Christia Reading, MD;  Location: [facility] OR;  Service: ENT;  Laterality: Left;   SEPTOPLASTY  1984   TOTAL HIP ARTHROPLASTY Right 11/2011   TOTAL HIP ARTHROPLASTY Left 04/2012   UMBILICAL HERNIA REPAIR  1998    Current Medications: Current Meds  Medication Sig   aspirin 81 MG tablet Take 1 tablet (81 mg total) by mouth daily.   B COMPLEX-C PO Take 1 tablet by mouth daily.   Cholecalciferol (VITAMIN D3) 5000 UNITS CAPS Take 5,000 Units by mouth daily.   Coenzyme Q10 300 MG CAPS Take 300 mg by mouth 2 (two) times daily.   latanoprost (XALATAN) 0.005 % ophthalmic solution Place 1 drop into both eyes at bedtime.   metoprolol succinate (TOPROL-XL) 25 MG  24 hr tablet TAKE ONE TABLET BY MOUTH ONE TIME DAILY   Omega-3 Fatty Acids (FISH OIL) 1000 MG CAPS Take 1 capsule by mouth daily.   OMEGA-3 KRILL OIL PO Take 1 capsule by mouth daily.   rosuvastatin (CRESTOR) 10 MG tablet Take 1 tablet (10 mg total) by mouth 3 (three) times a week.   triamterene-hydrochlorothiazide (DYAZIDE) 37.5-25 MG capsule TAKE ONE CAPSULE BY MOUTH ONCE A DAY   vitamin E 400 UNIT capsule Take 400 Units by mouth 2 (two) times daily.   [DISCONTINUED] ibuprofen (ADVIL,MOTRIN) 200 MG tablet Take 200 mg by mouth every 6 (six) hours as needed for mild pain.     Allergies:   Latex, Sulfa antibiotics, Sulfonamide derivatives, and Tape   Social History   Socioeconomic History   Marital status: Married    Spouse name: Not on file   Number of children: 2   Years of education: Not on file   Highest education level: Not on file  Occupational History    Comment: Engineer, building services  Tobacco Use   Smoking status: Never   Smokeless tobacco: Never  Substance and Sexual Activity   Alcohol use: Yes   Drug use: No   Sexual activity: Not on file  Other Topics Concern   Not on file  Social History Narrative   Not on file   Social Determinants of Health   Financial Resource Strain: Not on file  Food Insecurity: Not on file  Transportation Needs: Not on file  Physical Activity: Not on file  Stress: Not on file  Social Connections: Unknown (04/16/2022)   Received from [facility]   Social Network    Social Network: Not on file     Family History: The patient's family history includes Breast cancer in his mother; Diabetes in his mother; Hodgkin's lymphoma in his father; Other in his paternal grandmother.  ROS:   Please see the history of present illness.    All other systems reviewed and are negative.  EKGs/Labs/Other Studies Reviewed:    The following studies were reviewed today: EKG Interpretation Date/Time:  Tuesday July 31 2023 13:46:38 EDT Ventricular  Rate:  83 PR Interval:  194 QRS Duration:  94 QT Interval:  378 QTC Calculation: 444 R Axis:   -32  Text Interpretation: Normal sinus rhythm Left axis deviation Minimal voltage criteria for LVH, may be normal variant ( R in aVL ) When compared with ECG of 25-Aug-2003 08:12, No significant change was found Confirmed by Tonny Bollman [phone]) on 07/31/2023 1:56:30 PM    Recent Labs: No results found for requested labs within last 365 days.  Recent Lipid Panel    Component Value Date/Time   CHOL 113 03/16/2015 0919   TRIG 84.0 03/16/2015 0919   HDL 40.60 03/16/2015 0919   CHOLHDL 3 03/16/2015 0919   VLDL 16.8 03/16/2015 0919   LDLCALC 56 03/16/2015 0919     Risk Assessment/Calculations:                Physical Exam:    VS:  BP 130/86   Pulse 83   Ht 6\' 2"  (1.88 m)   Wt 265 lb 3.2 oz (120.3 kg)   SpO2 99%   BMI 34.05 kg/m     Wt Readings from Last 3 Encounters:  07/31/23 265 lb 3.2 oz (120.3 kg)  10/10/22 279 lb 12.8 oz (126.9 kg)  04/26/22 281 lb (127.5 kg)     GEN:  Well nourished, well developed in no acute distress HEENT: Normal NECK: No JVD; No carotid bruits LYMPHATICS: No lymphadenopathy CARDIAC: RRR, no murmurs, rubs, gallops RESPIRATORY:  Clear to auscultation without rales, wheezing or rhonchi  ABDOMEN: Soft, non-tender, non-distended MUSCULOSKELETAL:  No edema; No deformity  SKIN: Warm and dry NEUROLOGIC:  Alert and oriented x 3 PSYCHIATRIC:  Normal affect   ASSESSMENT:    1. Aortic atherosclerosis (HCC)   2. Aneurysm of ascending aorta without rupture (HCC)   3. Coronary artery disease involving native coronary artery of native heart without angina pectoris   4. Essential hypertension   5. Mixed hyperlipidemia    PLAN:    In order of problems listed above:  Treated with aspirin and statin drug Most recent assessment with chest MRA in 2023 showed a maximal diameter of the ascending aorta at 4.3 cm, stable from previous studies.  Recommend  gated chest CTA in 1 year prior to his return office visit. No anginal symptoms on current medical therapy.  He is treated with aspirin, a beta-blocker, and a statin drug. Blood pressure is well-controlled on his current medical regimen of metoprolol succinate and triamterene/HCTZ.  Check metabolic panel with his upcoming labs. Treated with rosuvastatin.  Will update lipids and LFTs, fasting labs ordered.     Medication Adjustments/Labs and Tests Ordered: Current medicines are reviewed at length with the patient today.  Concerns regarding medicines are outlined above.  Orders Placed This Encounter  Procedures   Comprehensive metabolic panel   Lipid panel   EKG 12-Lead   No orders of the defined types were placed in this encounter.   Patient Instructions  Medication Instructions:  Your physician recommends that you continue on your current medications as directed. Please refer to the Current Medication list given to you today.  *If you need a refill on your cardiac medications before your next appointment, please call your pharmacy*  Lab Work: CMET, lipids If you have labs (blood work) drawn today and your tests are completely normal, you will receive your results only by: MyChart Message (if you have MyChart) OR A paper copy in the mail If you have any lab test that is abnormal or we need to change your treatment, we will call you to review the results.  Testing/Procedures: Gated CT Angiogram (in 1 year) Non-Cardiac CT Angiography (CTA), is a special type of CT scan that uses a computer to produce multi-dimensional views of major blood vessels throughout the body. In CT angiography, a contrast material is injected through an IV to help visualize the blood vessels  Follow-Up: At [facility] you and your health needs are our priority.  As part  of our continuing mission to provide you with exceptional heart care, we have created designated Provider Care Teams.  These Care  Teams include your primary Cardiologist (physician) and Advanced Practice Providers (APPs -  Physician Assistants and Nurse Practitioners) who all work together to provide you with the care you need, when you need it.  Your next appointment:   1 year(s)  Provider:   Tonny Bollman, MD         Signed, Tonny Bollman, MD  07/31/2023 4:55 PM     [facility]

## 2023-07-31 NOTE — Patient Instructions (Signed)
Medication Instructions:  Your physician recommends that you continue on your current medications as directed. Please refer to the Current Medication list given to you today.  *If you need a refill on your cardiac medications before your next appointment, please call your pharmacy*  Lab Work: CMET, lipids If you have labs (blood work) drawn today and your tests are completely normal, you will receive your results only by: MyChart Message (if you have MyChart) OR A paper copy in the mail If you have any lab test that is abnormal or we need to change your treatment, we will call you to review the results.  Testing/Procedures: Gated CT Angiogram (in 1 year) Non-Cardiac CT Angiography (CTA), is a special type of CT scan that uses a computer to produce multi-dimensional views of major blood vessels throughout the body. In CT angiography, a contrast material is injected through an IV to help visualize the blood vessels  Follow-Up: At [facility], you and your health needs are our priority.  As part of our continuing mission to provide you with exceptional heart care, we have created designated Provider Care Teams.  These Care Teams include your primary Cardiologist (physician) and Advanced Practice Providers (APPs -  Physician Assistants and Nurse Practitioners) who all work together to provide you with the care you need, when you need it.  Your next appointment:   1 year(s)  Provider:   Tonny Bollman, MD

## 2023-08-08 ENCOUNTER — Ambulatory Visit: Payer: Medicare Other | Attending: Cardiovascular Disease

## 2023-08-08 DIAGNOSIS — I2510 Atherosclerotic heart disease of native coronary artery without angina pectoris: Secondary | ICD-10-CM

## 2023-08-08 DIAGNOSIS — I10 Essential (primary) hypertension: Secondary | ICD-10-CM | POA: Diagnosis not present

## 2023-08-08 DIAGNOSIS — I7121 Aneurysm of the ascending aorta, without rupture: Secondary | ICD-10-CM

## 2023-08-08 DIAGNOSIS — I700 Atherosclerosis of aorta: Secondary | ICD-10-CM | POA: Diagnosis not present

## 2023-08-08 DIAGNOSIS — E782 Mixed hyperlipidemia: Secondary | ICD-10-CM

## 2023-08-08 LAB — COMPREHENSIVE METABOLIC PANEL
ALT: 21 IU/L (ref 0–44)
AST: 20 IU/L (ref 0–40)
Albumin: 4 g/dL (ref 3.8–4.8)
Alkaline Phosphatase: 74 IU/L (ref 44–121)
BUN/Creatinine Ratio: 15 (ref 10–24)
BUN: 15 mg/dL (ref 8–27)
Bilirubin Total: 0.9 mg/dL (ref 0.0–1.2)
CO2: 24 mmol/L (ref 20–29)
Calcium: 9.2 mg/dL (ref 8.6–10.2)
Chloride: 103 mmol/L (ref 96–106)
Creatinine, Ser: 0.99 mg/dL (ref 0.76–1.27)
Globulin, Total: 2.8 g/dL (ref 1.5–4.5)
Glucose: 107 mg/dL — ABNORMAL HIGH (ref 70–99)
Potassium: 3.9 mmol/L (ref 3.5–5.2)
Sodium: 139 mmol/L (ref 134–144)
Total Protein: 6.8 g/dL (ref 6.0–8.5)
eGFR: 80 mL/min/{1.73_m2} (ref >59)

## 2023-08-08 LAB — LIPID PANEL
Chol/HDL Ratio: 3.3 ratio (ref 0.0–5.0)
Cholesterol, Total: 140 mg/dL (ref 100–199)
HDL: 43 mg/dL (ref >39)
LDL Chol Calc (NIH): 76 mg/dL (ref 0–99)
Triglycerides: 118 mg/dL (ref 0–149)
VLDL Cholesterol Cal: 21 mg/dL (ref 5–40)

## 2023-09-10 DIAGNOSIS — R270 Ataxia, unspecified: Secondary | ICD-10-CM | POA: Diagnosis not present

## 2023-09-10 DIAGNOSIS — H832X9 Labyrinthine dysfunction, unspecified ear: Secondary | ICD-10-CM | POA: Diagnosis not present

## 2023-09-10 DIAGNOSIS — M5416 Radiculopathy, lumbar region: Secondary | ICD-10-CM | POA: Diagnosis not present

## 2023-09-10 DIAGNOSIS — G609 Hereditary and idiopathic neuropathy, unspecified: Secondary | ICD-10-CM | POA: Diagnosis not present

## 2023-09-11 DIAGNOSIS — I10 Essential (primary) hypertension: Secondary | ICD-10-CM | POA: Diagnosis not present

## 2023-09-11 DIAGNOSIS — R351 Nocturia: Secondary | ICD-10-CM | POA: Diagnosis not present

## 2023-09-11 DIAGNOSIS — Z1212 Encounter for screening for malignant neoplasm of rectum: Secondary | ICD-10-CM | POA: Diagnosis not present

## 2023-09-11 DIAGNOSIS — Z1389 Encounter for screening for other disorder: Secondary | ICD-10-CM | POA: Diagnosis not present

## 2023-09-11 DIAGNOSIS — E785 Hyperlipidemia, unspecified: Secondary | ICD-10-CM | POA: Diagnosis not present

## 2023-09-12 DIAGNOSIS — H26492 Other secondary cataract, left eye: Secondary | ICD-10-CM | POA: Diagnosis not present

## 2023-09-12 DIAGNOSIS — H353232 Exudative age-related macular degeneration, bilateral, with inactive choroidal neovascularization: Secondary | ICD-10-CM | POA: Diagnosis not present

## 2023-09-12 DIAGNOSIS — H401131 Primary open-angle glaucoma, bilateral, mild stage: Secondary | ICD-10-CM | POA: Diagnosis not present

## 2023-09-13 DIAGNOSIS — H353211 Exudative age-related macular degeneration, right eye, with active choroidal neovascularization: Secondary | ICD-10-CM | POA: Diagnosis not present

## 2023-09-13 DIAGNOSIS — H353221 Exudative age-related macular degeneration, left eye, with active choroidal neovascularization: Secondary | ICD-10-CM | POA: Diagnosis not present

## 2023-09-13 DIAGNOSIS — H353231 Exudative age-related macular degeneration, bilateral, with active choroidal neovascularization: Secondary | ICD-10-CM | POA: Diagnosis not present

## 2023-09-13 DIAGNOSIS — H43821 Vitreomacular adhesion, right eye: Secondary | ICD-10-CM | POA: Diagnosis not present

## 2023-09-13 DIAGNOSIS — H43811 Vitreous degeneration, right eye: Secondary | ICD-10-CM | POA: Diagnosis not present

## 2023-09-17 ENCOUNTER — Ambulatory Visit: Payer: Medicare Other | Admitting: Cardiovascular Disease

## 2023-09-18 DIAGNOSIS — R82998 Other abnormal findings in urine: Secondary | ICD-10-CM | POA: Diagnosis not present

## 2023-09-18 DIAGNOSIS — Z1339 Encounter for screening examination for other mental health and behavioral disorders: Secondary | ICD-10-CM | POA: Diagnosis not present

## 2023-09-18 DIAGNOSIS — I2510 Atherosclerotic heart disease of native coronary artery without angina pectoris: Secondary | ICD-10-CM | POA: Diagnosis not present

## 2023-09-18 DIAGNOSIS — Z1331 Encounter for screening for depression: Secondary | ICD-10-CM | POA: Diagnosis not present

## 2023-09-18 DIAGNOSIS — Z0000 Encounter for general adult medical examination without abnormal findings: Secondary | ICD-10-CM | POA: Diagnosis not present

## 2023-09-18 DIAGNOSIS — I10 Essential (primary) hypertension: Secondary | ICD-10-CM | POA: Diagnosis not present

## 2023-10-04 DIAGNOSIS — H26492 Other secondary cataract, left eye: Secondary | ICD-10-CM | POA: Diagnosis not present

## 2023-10-10 NOTE — Progress Notes (Unsigned)
Subjective:    Patient ID: Tyler Bradley, male    DOB: [birth year], 72 y.o.   MRN: [id number]  HPI    male followed for OSA, complicated by CAD/ MI, obesity, history of parotid neoplasm, HBP NPSG- 06/02/1999- AHI 41/ hr  -----------------------------------------------------------------------------------   10/10/22- 72 year old male never smoker followed for OSA, complicated by Lung nodule, CAD/ MI, HTN, CVA, Aortic Atherosclerosis, Hyperlipidemia,  history of parotid neoplasm, Hepatitis, Morbid Obesity,  CPAP 12/Adapt Download compliance- N/A Body weight today-279 lbs Covid vax-  J&J Flu vax- declines He is happy still using old CPAP machine. Hasn't taken new one out of box. I noted a left subconjunctival hemorrhage-he is getting injections for wet AMD. He is comfortable with his CPAP reporting use every night and sleeping well. Nodule is being followed by Dr. Eloise Harman, who orders CT.  10/11/23- 72 year old male never smoker followed for OSA, complicated by Lung nodule, CAD/ MI, HTN, CVA, Aortic Atherosclerosis, Hyperlipidemia,  history of parotid neoplasm, Hepatitis, Morbid Obesity, Peripheral Neuropathy, Glaucoma/ Wet AMD, + CPAP 5-15/Adapt   ordered 09/22/21 Download compliance-  Body weight today-272 lbs Lung nodule is followed by Dr Eloise Harman CTa cardiac scoring-01/18/23- "stable 8 mm R nodule" Back to using new CPAP. AirView not available today. Declines flu vax. Can't sleep without CPAP. No concerns. Used old machine until reservoir started to leak and now uses the new one. Denies change in cardiac status.  ROS-see HPI   + = positive Constitutional:    weight loss, night sweats, fevers, chills, fatigue, lassitude. HEENT:    headaches, difficulty swallowing, tooth/dental problems, sore throat,       sneezing, itching, ear ache, nasal congestion, post nasal drip, snoring CV:    chest pain, orthopnea, PND, swelling in lower extremities, anasarca,                                dizziness, palpitations Resp:   shortness of breath with exertion or at rest.                productive cough,   non-productive cough, coughing up of blood.              change in color of mucus.  wheezing.   Skin:    rash or lesions. GI:  No-   heartburn, indigestion, abdominal pain, nausea, vomiting, diarrhea,                 change in bowel habits, loss of appetite GU: dysuria, change in color of urine, no urgency or frequency.   flank pain. MS:   joint pain, stiffness, decreased range of motion, back pain. Neuro-     nothing unusual Psych:  change in mood or affect.  depression or anxiety.   memory loss.     Objective:  OBJ- Physical Exam General- Alert, Oriented, Affect-appropriate, Distress- none acute Skin- rash-none, lesions- none, excoriation- none Lymphadenopathy- none Head- atraumatic            Eyes-+ L subconjunctival hemorrhage from injection            Ears- Hearing, canals-normal            Nose- Clear, no-Septal dev, mucus, polyps, erosion, perforation             Throat- Mallampati III , mucosa clear , drainage- none, tonsils- atrophic Neck- flexible , trachea midline, no stridor , thyroid nl, carotid no bruit Chest - symmetrical excursion ,  unlabored           Heart/CV- RRR , no murmur , no gallop  , no rub, nl s1 s2                           - JVD- none , edema- none, stasis changes- none, varices- none           Lung- clear to P&A, wheeze- none, cough- none , dullness-none, rub- none           Chest wall-  Abd-  Br/ Gen/ Rectal- Not done, not indicated Extrem- cyanosis- none, clubbing, none, atrophy- none, strength- nl Neuro- grossly intact to observation         Assessment & Plan:

## 2023-10-11 ENCOUNTER — Encounter: Payer: Self-pay | Admitting: Internal Medicine

## 2023-10-11 ENCOUNTER — Ambulatory Visit: Payer: Medicare Other | Admitting: Internal Medicine

## 2023-10-11 VITALS — BP 122/78 | HR 100 | Ht 74.0 in | Wt 272.4 lb

## 2023-10-11 DIAGNOSIS — R911 Solitary pulmonary nodule: Secondary | ICD-10-CM

## 2023-10-11 DIAGNOSIS — G4733 Obstructive sleep apnea (adult) (pediatric): Secondary | ICD-10-CM | POA: Diagnosis not present

## 2023-10-11 NOTE — Assessment & Plan Note (Signed)
Benefits from CPAP with good compliance and improved sleep. Plan- continue auto 5-15

## 2023-10-11 NOTE — Patient Instructions (Signed)
We can continue CPAP- glad it is working well for you.  Please call if we can help

## 2023-10-11 NOTE — Assessment & Plan Note (Signed)
Followed by his primary physician

## 2023-10-17 DIAGNOSIS — G4733 Obstructive sleep apnea (adult) (pediatric): Secondary | ICD-10-CM | POA: Diagnosis not present

## 2023-10-23 ENCOUNTER — Other Ambulatory Visit: Payer: Self-pay | Admitting: Cardiovascular Disease

## 2023-10-24 ENCOUNTER — Other Ambulatory Visit: Payer: Self-pay | Admitting: Cardiovascular Disease

## 2023-10-25 DIAGNOSIS — H43821 Vitreomacular adhesion, right eye: Secondary | ICD-10-CM | POA: Diagnosis not present

## 2023-10-25 DIAGNOSIS — H353231 Exudative age-related macular degeneration, bilateral, with active choroidal neovascularization: Secondary | ICD-10-CM | POA: Diagnosis not present

## 2023-10-25 DIAGNOSIS — H43811 Vitreous degeneration, right eye: Secondary | ICD-10-CM | POA: Diagnosis not present

## 2023-10-25 DIAGNOSIS — H353132 Nonexudative age-related macular degeneration, bilateral, intermediate dry stage: Secondary | ICD-10-CM | POA: Diagnosis not present

## 2023-10-26 ENCOUNTER — Encounter: Payer: Self-pay | Admitting: Pediatrics

## 2023-11-16 DIAGNOSIS — G4733 Obstructive sleep apnea (adult) (pediatric): Secondary | ICD-10-CM | POA: Diagnosis not present

## 2023-12-10 DIAGNOSIS — H2511 Age-related nuclear cataract, right eye: Secondary | ICD-10-CM | POA: Diagnosis not present

## 2023-12-10 DIAGNOSIS — H401131 Primary open-angle glaucoma, bilateral, mild stage: Secondary | ICD-10-CM | POA: Diagnosis not present

## 2023-12-10 DIAGNOSIS — H353232 Exudative age-related macular degeneration, bilateral, with inactive choroidal neovascularization: Secondary | ICD-10-CM | POA: Diagnosis not present

## 2023-12-10 DIAGNOSIS — Z961 Presence of intraocular lens: Secondary | ICD-10-CM | POA: Diagnosis not present

## 2023-12-13 DIAGNOSIS — H43821 Vitreomacular adhesion, right eye: Secondary | ICD-10-CM | POA: Diagnosis not present

## 2023-12-13 DIAGNOSIS — H353231 Exudative age-related macular degeneration, bilateral, with active choroidal neovascularization: Secondary | ICD-10-CM | POA: Diagnosis not present

## 2023-12-13 DIAGNOSIS — H353132 Nonexudative age-related macular degeneration, bilateral, intermediate dry stage: Secondary | ICD-10-CM | POA: Diagnosis not present

## 2023-12-13 DIAGNOSIS — H43811 Vitreous degeneration, right eye: Secondary | ICD-10-CM | POA: Diagnosis not present

## 2023-12-17 DIAGNOSIS — G4733 Obstructive sleep apnea (adult) (pediatric): Secondary | ICD-10-CM | POA: Diagnosis not present

## 2023-12-17 NOTE — Progress Notes (Signed)
 [id number] Initial Consultation   Referring Provider Tyler Toribio MATSU, MD [address] [place],  [place] [address]  Primary Care Provider Tyler Toribio MATSU, MD  Patient Profile: Tyler Bradley is a 73 y.o. male with a past medical history noteworthy for CAD w/MI in 2007 status post PCI, ascending thoracic aortic aneurysm, HTN, HLD, OSA on CPAP and history of CVA who is seen in consultation in the [facility] at the request of Dr. Yolande for evaluation and management of the problem(s) noted below.  Problem List: Positive Hemoccult History of colon polyps   History of Present Illness   Tyler Bradley is a 73 y.o. male with a history of CAD and ACS in 2007 status post PCI, ascending thoracic aortic aneurysm, HTN, HLD, OSA on CPAP and history of CVA who presents to the office to discuss colonoscopy in the setting of positive Hemoccult and history of colon polyps  In speaking with Tyler Bradley as well as reviewing his prior medical records, his procedure history is noteworthy for: -- Colonoscopy 07/2014 -2 polyps (? Histology) and diverticulosis -- Colonoscopy 04/2020 -reportedly done without results available  Records indicate that he had a positive stool Hemoccult  Reports normal bowel habits having 1 formed bowel movement a day without any blood or mucus No fecal urgency, tenesmus or nocturnal bowel movements No melena or hematochezia No abdominal pain or cramping  Weight, energy and appetite are stable  No known family history of colorectal cancer or polyps Denies history of anemia  He has a pertinent past medical history of coronary artery disease and MI in the remote past in 2007 Last seen by cardiology 07/31/2023 and deemed to be overall stable Echo and myocardial perfusion scan in last 2 years have shown LVEF 52 to 60%; moderate dilation of aortic root up to 44 mm Denies dyspnea on exertion or swelling Has CPAP for OSA  Last  colonoscopy: 04/2020 - result not available Last endoscopy: 04/2020 - result not available  Last Abd CT/CTE/MRE: N/A  GI Review of Symptoms Significant for None. Otherwise negative.  General Review of Systems  Review of systems is significant for the pertinent positives and negatives as listed per the HPI.  Full ROS is otherwise negative.  Past Medical History   Past Medical History:  Diagnosis Date   Aortic atherosclerosis (HCC)    Noted on CT in 01/2021   Arthritis    OA    Basal cell carcinoma    CAD (coronary artery disease)    S/p ACS in 2007 >> PCI:  BMS x 2 to RCA (overlapping) - [place] // ETT in 2017: no ST changes, normal ex tol // Myoview  9/19: EF 58, fixed inf defect w normal wall motion (probable diaph atten), no ischemia; low risk // Myoview  1/22: EF 52, inf infarct, no ischemia; low risk    Fatigue    Glaucoma    Heart attack (HCC)    2007 DR. WONDA    Hepatitis    History of rheumatic fever    HTN (hypertension)    Hx of CVA    04/2012   Hyperlipemia    Lung nodule    Chest CTA 2/22: 8 mm RLL nodule // Chest CT 8/22: Right lung base nodule unchanged at 0.8 cm; thoracic aortic aneurysm 4.4 x 4.3 cm.   OSA (obstructive sleep apnea)    CPAP    Thoracic aortic aneurysm (HCC)    Chest CTA 2/22: Asc thoracic Ao aneurysm 4.3 cm; 8  mm RLL nodule; cholelithiasis; aortic atherosclerosis // Echo 2/22: EF 55-60, no RWMA, mild LVH, GR 1 DD, normal RVSF, dilated aortic root (44 mm), dilated ascending aorta (43 mm) // Chest MRA 2/23: Asc Aorta 43 mm      Past Surgical History   Past Surgical History:  Procedure Laterality Date   BASAL CELL CARCINOMA EXCISION     from forehead   CORONARY ANGIOPLASTY WITH STENT PLACEMENT  2007   PAROTIDECTOMY Left 09/04/2014   Procedure: PAROTIDECTOMY;  Surgeon: Tyler Ricker, MD;  Location[facility] OR;  Service: ENT;  Laterality: Left;   SEPTOPLASTY  1984   TOTAL HIP ARTHROPLASTY Right 11/2011   TOTAL HIP ARTHROPLASTY Left 04/2012    UMBILICAL HERNIA REPAIR  1998     Allergies and Medications   Allergies  Allergen Reactions   Latex     Slight rash   Other Itching    Animal Dander   Sulfa Antibiotics Itching   Sulfonamide Derivatives Other (See Comments)    Drug induced hepatitis.    Tape     Adhesive tape leaves a rash    Current Meds  Medication Sig   aflibercept  (EYLEA  HD) 8 MG/0.07ML ophthalmic injection by Intravitreal route every 7 (seven) weeks.   aspirin  81 MG tablet Take 1 tablet (81 mg total) by mouth daily.   B COMPLEX-C PO Take 1 tablet by mouth daily.   Cholecalciferol  (VITAMIN D3) 5000 UNITS CAPS Take 5,000 Units by mouth daily.   Coenzyme Q10 300 MG CAPS Take 300 mg by mouth 2 (two) times daily.   ibuprofen (ADVIL) 200 MG tablet Take 200 mg by mouth as needed.   latanoprost  (XALATAN ) 0.005 % ophthalmic solution Place 1 drop into both eyes at bedtime.   metoprolol  succinate (TOPROL -XL) 25 MG 24 hr tablet TAKE ONE TABLET BY MOUTH ONCE A DAY   Multiple Vitamins-Minerals (ICAPS AREDS 2 PO) Take 1 tablet by mouth daily.   Omega-3 Fatty Acids (FISH OIL) 1000 MG CAPS Take 1 capsule by mouth daily.   OMEGA-3 KRILL OIL PO Take 1 capsule by mouth daily.   rosuvastatin  (CRESTOR ) 10 MG tablet Take 1 tablet (10 mg total) by mouth 3 (three) times a week.   Saw Palmetto 160 MG CAPS Take 1 capsule by mouth daily.   triamterene -hydrochlorothiazide  (DYAZIDE ) 37.5-25 MG capsule TAKE ONE CAPSULE BY MOUTH ONCE A DAY   vitamin E 400 UNIT capsule Take 400 Units by mouth 2 (two) times daily.     Family History   Family History  Problem Relation Age of Onset   Breast cancer Mother    Diabetes Mother    Hodgkin's lymphoma Father    Lung cancer Sister    Alzheimer's disease Brother    Other Paternal Grandmother        Brain tumor   Anuerysm Paternal Grandfather        Brain     Social History  Widowed-wife passed away from breast cancer August 2024 Consumes 1-2 alcoholic beverages per week Attempts to  participate in exercise  Tyler Bradley reports that he has never smoked. He has never used smokeless tobacco. He reports current alcohol  use. He reports that he does not use drugs.  Vital Signs and Physical Examination   Vitals:   12/18/23 1430  BP: 124/84  Pulse: (!) 112  Height: 5' 11.75 (1.822 m) Comment: height measured without shoes  Weight: 278 lb 6 oz (126.3 kg)  BMI (Calculated): 38.04  General: Well developed, well nourished, no acute  distress Head: Normocephalic and atraumatic Eyes: Sclerae anicteric, EOMI Ears: Normal auditory acuity Mouth: No deformities or lesions noted Lungs: Clear throughout to auscultation Heart: Regular rate and rhythm; No murmurs, rubs or bruits Abdomen: Soft, non tender and non distended. No masses, hepatosplenomegaly or hernias noted. Normal Bowel sounds Rectal: Musculoskeletal: Symmetrical with no gross deformities  Pulses:  Normal pulses noted Extremities: No edema or deformities noted Neurological: Alert oriented x 4, grossly nonfocal Psychological:  Alert and cooperative. Normal mood and affect  Review of Data  The following data was reviewed at the time of this encounter:  Laboratory Studies   Lab Results  Component Value Date   WBC 6.4 12/23/2020   HGB 15.6 12/23/2020   HCT 45.3 12/23/2020   MCV 88 12/23/2020   PLT 212 12/23/2020     Chemistry      Component Value Date/Time   NA 139 08/08/2023 1019   K 3.9 08/08/2023 1019   CL 103 08/08/2023 1019   CO2 24 08/08/2023 1019   BUN 15 08/08/2023 1019   CREATININE 0.99 08/08/2023 1019      Component Value Date/Time   CALCIUM  9.2 08/08/2023 1019   ALKPHOS 74 08/08/2023 1019   AST 20 08/08/2023 1019   ALT 21 08/08/2023 1019   BILITOT 0.9 08/08/2023 1019      Lab Results  Component Value Date   TSH 2.950 12/23/2020     Imaging Studies  None  GI Procedures and Studies  -- Colonoscopy 07/2014 -2 polyps (? Histology) and diverticulosis -- Colonoscopy 04/2020 -reportedly  done without results available  Clinical Impression  It is my clinical impression that Tyler Bradley is a 73 y.o. male with;  Positive Hemoccult History of colon polyps  Tyler Bradley presents to discuss performing surveillance colonoscopy in the setting of positive Hemoccult.  Colonoscopy in 2015 showed 2 polyps with unknown histology.  Last colonoscopy was performed in 2021, however, results are not available.  He is not endorsing any active GI symptoms such as melena, hematochezia or change in bowel habits.  We reviewed that it would be appropriate to proceed with surveillance colonoscopy.  Risk versus benefits were reviewed and he is amenable to proceeding.  Plan  Schedule surveillance colonoscopy in [facility]  Planned Follow Up PRN  The patient or caregiver verbalized understanding of the material covered, with no barriers to understanding. All questions were answered. Patient or caregiver is agreeable with the plan outlined above.    It was a pleasure to see Tyler Bradley.  If you have any questions or concerns regarding this evaluation, do not hesitate to contact me.  Inocente Hausen, MD [facility]   I spent total of 30 minutes in both face-to-face and non-face-to-face activities, excluding procedures performed, for the visit on the date of this encounter.

## 2023-12-18 ENCOUNTER — Encounter: Payer: Self-pay | Admitting: Pediatrics

## 2023-12-18 ENCOUNTER — Ambulatory Visit: Payer: Medicare Other | Admitting: Pediatrics

## 2023-12-18 VITALS — BP 124/84 | HR 112 | Ht 71.75 in | Wt 278.4 lb

## 2023-12-18 DIAGNOSIS — R195 Other fecal abnormalities: Secondary | ICD-10-CM | POA: Diagnosis not present

## 2023-12-18 DIAGNOSIS — Z860100 Personal history of colon polyps, unspecified: Secondary | ICD-10-CM | POA: Diagnosis not present

## 2023-12-18 NOTE — Patient Instructions (Addendum)
 You have been scheduled for a colonoscopy. Please follow written instructions given to you at your visit today.   Please pick up your prep supplies at the pharmacy within the next 1-3 days.  If you use inhalers (even only as needed), please bring them with you on the day of your procedure.  DO NOT TAKE 7 DAYS PRIOR TO TEST- Trulicity (dulaglutide) Ozempic, Wegovy (semaglutide) Mounjaro (tirzepatide) Bydureon Bcise (exanatide extended release)  DO NOT TAKE 1 DAY PRIOR TO YOUR TEST Rybelsus (semaglutide) Adlyxin (lixisenatide) Victoza (liraglutide) Byetta (exanatide) ___________________________________________________________________________  _______________________________________________________  If your blood pressure at your visit was 140/90 or greater, please contact your primary care physician to follow up on this.  _______________________________________________________  If you are age 50 or older, your body mass index should be between 23-30. Your Body mass index is 38.02 kg/m. If this is out of the aforementioned range listed, please consider follow up with your Primary Care Provider.  If you are age 45 or younger, your body mass index should be between 19-25. Your Body mass index is 38.02 kg/m. If this is out of the aformentioned range listed, please consider follow up with your Primary Care Provider.   ________________________________________________________  The [facility] providers would like to encourage you to use MYCHART to communicate with providers for non-urgent requests or questions.  Due to long hold times on the telephone, sending your provider a message by [facility] may be a faster and more efficient way to get a response.  Please allow 48 business hours for a response.  Please remember that this is for non-urgent requests.  _______________________________________________________  Due to recent changes in healthcare laws, you may see the results of your imaging  and laboratory studies on MyChart before your provider has had a chance to review them.  We understand that in some cases there may be results that are confusing or concerning to you. Not all laboratory results come back in the same time frame and the provider may be waiting for multiple results in order to interpret others.  Please give us  48 hours in order for your provider to thoroughly review all the results before contacting the office for clarification of your results.   Thank you for entrusting me with your care and choosing [facility].  Dr Suzann

## 2024-01-02 DIAGNOSIS — J3489 Other specified disorders of nose and nasal sinuses: Secondary | ICD-10-CM | POA: Diagnosis not present

## 2024-01-02 DIAGNOSIS — J189 Pneumonia, unspecified organism: Secondary | ICD-10-CM | POA: Diagnosis not present

## 2024-01-02 DIAGNOSIS — R058 Other specified cough: Secondary | ICD-10-CM | POA: Diagnosis not present

## 2024-01-09 ENCOUNTER — Encounter: Payer: Self-pay | Admitting: Pediatrics

## 2024-01-15 NOTE — Progress Notes (Unsigned)
[facility] History and Physical   Primary Care Physician:  Garlan Fillers, MD   Reason for Procedure:  History of colon polyps and positive Hemoccult  Plan:    Surveillance colonoscopy   HPI: Tyler Bradley is a 73 y.o. male with a history of CAD and ACS in 2007 status post PCI, ascending thoracic aortic aneurysm, HTN, HLD, OSA on CPAP and history of CVA undergoing surveillance colonoscopy for history of colon polyps and recent positive Hemoccult.  His procedure history is noteworthy for: -- Colonoscopy 07/2014 -2 polyps (? Histology) and diverticulosis -- Colonoscopy 04/2020 -reportedly done without results available   Records indicate that he had a positive stool Hemoccult   Reports normal bowel habits having 1 formed bowel movement a day without any blood or mucus No fecal urgency, tenesmus or nocturnal bowel movements No melena or hematochezia No abdominal pain or cramping   Weight, energy and appetite are stable   No known family history of colorectal cancer or polyps Denies history of anemia  Past Medical History:  Diagnosis Date   Aortic atherosclerosis (HCC)    Noted on CT in 01/2021   Arthritis    OA    Basal cell carcinoma    CAD (coronary artery disease)    S/p ACS in 2007 >> PCI:  BMS x 2 to RCA (overlapping) - [place], [place] // ETT in 2017: no ST changes, normal ex tol // Myoview 9/19: EF 58, fixed inf defect w normal wall motion (probable diaph atten), no ischemia; low risk // Myoview 1/22: EF 52, inf infarct, no ischemia; low risk    Fatigue    Glaucoma    Heart attack (HCC)    2007 DR. Excell Seltzer    Hepatitis    History of rheumatic fever    HTN (hypertension)    Hx of CVA    04/2012   Hyperlipemia    Lung nodule    Chest CTA 2/22: 8 mm RLL nodule // Chest CT 8/22: Right lung base nodule unchanged at 0.8 cm; thoracic aortic aneurysm 4.4 x 4.3 cm.   OSA (obstructive sleep apnea)    CPAP    Thoracic aortic aneurysm (HCC)    Chest CTA  2/22: Asc thoracic Ao aneurysm 4.3 cm; 8 mm RLL nodule; cholelithiasis; aortic atherosclerosis // Echo 2/22: EF 55-60, no RWMA, mild LVH, GR 1 DD, normal RVSF, dilated aortic root (44 mm), dilated ascending aorta (43 mm) // Chest MRA 2/23: Asc Aorta 43 mm    Past Surgical History:  Procedure Laterality Date   BASAL CELL CARCINOMA EXCISION     from forehead   CORONARY ANGIOPLASTY WITH STENT PLACEMENT  2007   PAROTIDECTOMY Left 09/04/2014   Procedure: PAROTIDECTOMY;  Surgeon: Christia Reading, MD;  Location: [facility] OR;  Service: ENT;  Laterality: Left;   SEPTOPLASTY  1984   TOTAL HIP ARTHROPLASTY Right 11/2011   TOTAL HIP ARTHROPLASTY Left 04/2012   UMBILICAL HERNIA REPAIR  1998    Prior to Admission medications   Medication Sig Start Date End Date Taking? Authorizing Provider  aflibercept (EYLEA HD) 8 MG/0.07ML ophthalmic injection by Intravitreal route every 7 (seven) weeks.    [provider]  aspirin 81 MG tablet Take 1 tablet (81 mg total) by mouth daily. 05/11/17   Tonny Bollman, MD  B COMPLEX-C PO Take 1 tablet by mouth daily.    [provider]  Cholecalciferol (VITAMIN D3) 5000 UNITS CAPS Take 5,000 Units by mouth daily.    [provider]  Coenzyme Q10 300 MG CAPS Take 300 mg by mouth 2 (two) times daily.    [provider]  ibuprofen (ADVIL) 200 MG tablet Take 200 mg by mouth as needed.    [provider]  latanoprost (XALATAN) 0.005 % ophthalmic solution Place 1 drop into both eyes at bedtime.    [provider]  metoprolol succinate (TOPROL-XL) 25 MG 24 hr tablet TAKE ONE TABLET BY MOUTH ONCE A DAY 10/25/23   Tonny Bollman, MD  Multiple Vitamins-Minerals (ICAPS AREDS 2 PO) Take 1 tablet by mouth daily.    [provider]  Omega-3 Fatty Acids (FISH OIL) 1000 MG CAPS Take 1 capsule by mouth daily.    [provider]  OMEGA-3 KRILL OIL PO Take 1 capsule by mouth daily.    [provider]  rosuvastatin  (CRESTOR) 10 MG tablet Take 1 tablet (10 mg total) by mouth 3 (three) times a week. 12/20/20   Alben Spittle, Scott T, PA-C  Saw Palmetto 160 MG CAPS Take 1 capsule by mouth daily. 03/08/22   [provider]  triamterene-hydrochlorothiazide (DYAZIDE) 37.5-25 MG capsule TAKE ONE CAPSULE BY MOUTH ONCE A DAY 10/24/23   Tonny Bollman, MD  vitamin E 400 UNIT capsule Take 400 Units by mouth 2 (two) times daily.    [provider]    Current Outpatient Medications  Medication Sig Dispense Refill   aspirin 81 MG tablet Take 1 tablet (81 mg total) by mouth daily.     B COMPLEX-C PO Take 1 tablet by mouth daily.     benzonatate (TESSALON) 200 MG capsule 1 capsule as needed Orally Three times a day for cough for 10 days     Cholecalciferol (VITAMIN D3) 5000 UNITS CAPS Take 5,000 Units by mouth daily.     Coenzyme Q10 300 MG CAPS Take 300 mg by mouth 2 (two) times daily.     ibuprofen (ADVIL) 200 MG tablet Take 200 mg by mouth as needed.     latanoprost (XALATAN) 0.005 % ophthalmic solution Place 1 drop into both eyes at bedtime.     metoprolol succinate (TOPROL-XL) 25 MG 24 hr tablet TAKE ONE TABLET BY MOUTH ONCE A DAY 90 tablet 3   Multiple Vitamins-Minerals (ICAPS AREDS 2 PO) Take 1 tablet by mouth daily.     Omega-3 Fatty Acids (FISH OIL) 1000 MG CAPS Take 1 capsule by mouth daily.     OMEGA-3 KRILL OIL PO Take 1 capsule by mouth daily.     rosuvastatin (CRESTOR) 10 MG tablet Take 1 tablet (10 mg total) by mouth 3 (three) times a week. 45 tablet 3   Saw Palmetto 160 MG CAPS Take 1 capsule by mouth daily.     triamterene-hydrochlorothiazide (DYAZIDE) 37.5-25 MG capsule TAKE ONE CAPSULE BY MOUTH ONCE A DAY 90 capsule 2   vitamin E 400 UNIT capsule Take 400 Units by mouth 2 (two) times daily.     aflibercept (EYLEA HD) 8 MG/0.07ML ophthalmic injection by Intravitreal route every 7 (seven) weeks.     Current Facility-Administered Medications  Medication Dose Route Frequency Provider Last Rate  Last Admin   0.9 %  sodium chloride infusion  500 mL Intravenous Continuous Treva Huyett, Durene Romans, MD        Allergies as of 01/17/2024 - Review Complete 01/17/2024  Allergen Reaction Noted   Sulfonamide derivatives Other (See Comments)    Latex Other (See Comments) 04/24/2016   Other Itching 09/13/2011   Sulfa antibiotics Itching 09/05/2014  Tape Other (See Comments) 04/16/2012    Family History  Problem Relation Age of Onset   Breast cancer Mother    Diabetes Mother    Hodgkin's lymphoma Father    Lung cancer Sister    Alzheimer's disease Brother    Other Paternal Grandmother        Brain tumor   Anuerysm Paternal Grandfather        Brain   Colon cancer Neg Hx    Rectal cancer Neg Hx    Stomach cancer Neg Hx    Esophageal cancer Neg Hx     Social History   Socioeconomic History   Marital status: Widowed    Spouse name: Not on file   Number of children: 2   Years of education: Not on file   Highest education level: Not on file  Occupational History    Comment: Engineer, building services  Tobacco Use   Smoking status: Never   Smokeless tobacco: Never  Vaping Use   Vaping status: Never Used  Substance and Sexual Activity   Alcohol use: Yes    Comment: a glass of wine 1-2 times a week   Drug use: No   Sexual activity: Not on file  Other Topics Concern   Not on file  Social History Narrative   Not on file   Social Drivers of Health   Financial Resource Strain: Not on file  Food Insecurity: Not on file  Transportation Needs: Not on file  Physical Activity: Not on file  Stress: Not on file  Social Connections: Unknown (04/16/2022)   Received from [facility]   Social Network    Social Network: Not on file  Intimate Partner Violence: Unknown (03/08/2022)   Received from [facility]   HITS    Physically Hurt: Not on file    Insult or Talk Down To: Not on file    Threaten Physical Harm: Not on file    Scream or Curse: Not on file     Review of Systems:  All other review of systems negative except as mentioned in the HPI.  Physical Exam: Vital signs BP (!) 159/106   Pulse 95   Temp 98.2 F (36.8 C)   Ht 5\' 11"  (1.803 m)   Wt 278 lb (126.1 kg)   SpO2 96%   BMI 38.77 kg/m   General:   Alert,  Well-developed, well-nourished, pleasant and cooperative in NAD Airway:  Mallampati 3 Lungs:  Clear throughout to auscultation.   Heart:  Regular rate and rhythm; no murmurs, clicks, rubs,  or gallops. Abdomen:  Soft, nontender and nondistended. Normal bowel sounds.   Neuro/Psych:  Normal mood and affect. A and O x 3  Maren Beach, MD [facility]

## 2024-01-17 ENCOUNTER — Ambulatory Visit: Payer: Medicare Other | Admitting: Pediatrics

## 2024-01-17 ENCOUNTER — Encounter: Payer: Self-pay | Admitting: Pediatrics

## 2024-01-17 VITALS — BP 137/83 | HR 91 | Temp 98.2°F | Resp 20 | Ht 71.0 in | Wt 278.0 lb

## 2024-01-17 DIAGNOSIS — R195 Other fecal abnormalities: Secondary | ICD-10-CM | POA: Diagnosis not present

## 2024-01-17 DIAGNOSIS — K648 Other hemorrhoids: Secondary | ICD-10-CM | POA: Diagnosis not present

## 2024-01-17 DIAGNOSIS — Z1211 Encounter for screening for malignant neoplasm of colon: Secondary | ICD-10-CM | POA: Diagnosis not present

## 2024-01-17 DIAGNOSIS — Z860100 Personal history of colon polyps, unspecified: Secondary | ICD-10-CM

## 2024-01-17 DIAGNOSIS — D123 Benign neoplasm of transverse colon: Secondary | ICD-10-CM | POA: Diagnosis not present

## 2024-01-17 DIAGNOSIS — D120 Benign neoplasm of cecum: Secondary | ICD-10-CM | POA: Diagnosis not present

## 2024-01-17 DIAGNOSIS — K5730 Diverticulosis of large intestine without perforation or abscess without bleeding: Secondary | ICD-10-CM | POA: Diagnosis not present

## 2024-01-17 MED ORDER — SODIUM CHLORIDE 0.9 % IV SOLN: 500.0000 mL | INTRAVENOUS | Status: DC

## 2024-01-17 NOTE — Progress Notes (Signed)
Report to PACU, RN, vss, BBS= Clear.

## 2024-01-17 NOTE — Patient Instructions (Signed)

## 2024-01-17 NOTE — Progress Notes (Signed)
Called to room to assist during endoscopic procedure.  Patient ID and intended procedure confirmed with present staff. Received instructions for my participation in the procedure from the performing physician.

## 2024-01-17 NOTE — Progress Notes (Signed)
Pt's states no medical or surgical changes since previsit or office visit.

## 2024-01-17 NOTE — Op Note (Signed)
[facility] Patient Name: Tyler Bradley Procedure Date: 01/17/2024 10:13 AM MRN: [id number] Endoscopist: Maren Beach , MD, [id number] Age: 73 Referring MD:  Date of Birth: [birth year] Gender: Male Account #: [id number] Procedure:                Colonoscopy Indications:              High risk colon cancer surveillance: Personal                            history of colonic polyps, Last colonoscopy: 2021,                            FOBT + Medicines:                Monitored Anesthesia Care Procedure:                Pre-Anesthesia Assessment:                           - Prior to the procedure, a History and Physical                            was performed, and patient medications and                            allergies were reviewed. The patient's tolerance of                            previous anesthesia was also reviewed. The risks                            and benefits of the procedure and the sedation                            options and risks were discussed with the patient.                            All questions were answered, and informed consent                            was obtained. Prior Anticoagulants: The patient has                            taken no anticoagulant or antiplatelet agents. ASA                            Grade Assessment: III - A patient with severe                            systemic disease. After reviewing the risks and                            benefits, the patient was deemed in satisfactory  condition to undergo the procedure.                           After obtaining informed consent, the colonoscope                            was passed under direct vision. Throughout the                            procedure, the patient's blood pressure, pulse, and                            oxygen saturations were monitored continuously. The                            Olympus CF-HQ190L ([id number]) Colonoscope was                             introduced through the anus and advanced to the                            cecum, identified by appendiceal orifice and                            ileocecal valve. The colonoscopy was performed                            without difficulty. The patient tolerated the                            procedure well. The quality of the bowel                            preparation was excellent. The ileocecal valve, the                            appendiceal orifice and the rectum were                            photographed. Scope In: 10:18:29 AM Scope Out: 10:35:34 AM Scope Withdrawal Time: 0 hours 11 minutes 5 seconds  Total Procedure Duration: 0 hours 17 minutes 5 seconds  Findings:                 The perianal and digital rectal examinations were                            normal. Pertinent negatives include normal                            sphincter tone and no palpable rectal lesions.                           A few small-mouthed diverticula were found in the  sigmoid colon and descending colon.                           Two sessile polyps were found in the transverse                            colon and cecum. The polyps were 3 to 4 mm in size.                            These polyps were removed with a cold biopsy                            forceps. Resection and retrieval were complete.                           A 6 mm polyp was found in the cecum. The polyp was                            sessile. The polyp was removed with a cold snare.                            Resection and retrieval were complete.                           Internal hemorrhoids were found during retroflexion. Complications:            No immediate complications. Estimated blood loss:                            Minimal. Estimated Blood Loss:     Estimated blood loss was minimal. Impression:               - Diverticulosis in the sigmoid colon and in the                             descending colon.                           - Two 3 to 4 mm polyps in the transverse colon and                            in the cecum, removed with a cold biopsy forceps.                            Resected and retrieved.                           - One 6 mm polyp in the cecum, removed with a cold                            snare. Resected and retrieved.                           - Internal hemorrhoids. Recommendation:           -  Discharge patient to home (ambulatory).                           - Await pathology results.                           - Repeat colonoscopy for surveillance based on                            pathology results.                           - The findings and recommendations were discussed                            with the patient's family.                           - Return to referring physician.                           - Patient has a contact number available for                            emergencies. The signs and symptoms of potential                            delayed complications were discussed with the                            patient. Return to normal activities tomorrow.                            Written discharge instructions were provided to the                            patient. Maren Beach, MD 01/17/2024 10:42:00 AM This report has been signed electronically.

## 2024-01-18 ENCOUNTER — Telehealth: Payer: Self-pay

## 2024-01-18 NOTE — Telephone Encounter (Signed)
Left message on follow up call.

## 2024-01-21 ENCOUNTER — Encounter: Payer: Self-pay | Admitting: Pediatrics

## 2024-01-21 DIAGNOSIS — J189 Pneumonia, unspecified organism: Secondary | ICD-10-CM | POA: Diagnosis not present

## 2024-01-21 LAB — SURGICAL PATHOLOGY

## 2024-01-23 DIAGNOSIS — M65342 Trigger finger, left ring finger: Secondary | ICD-10-CM | POA: Diagnosis not present

## 2024-01-31 DIAGNOSIS — H353132 Nonexudative age-related macular degeneration, bilateral, intermediate dry stage: Secondary | ICD-10-CM | POA: Diagnosis not present

## 2024-01-31 DIAGNOSIS — H43821 Vitreomacular adhesion, right eye: Secondary | ICD-10-CM | POA: Diagnosis not present

## 2024-01-31 DIAGNOSIS — H43811 Vitreous degeneration, right eye: Secondary | ICD-10-CM | POA: Diagnosis not present

## 2024-01-31 DIAGNOSIS — H353231 Exudative age-related macular degeneration, bilateral, with active choroidal neovascularization: Secondary | ICD-10-CM | POA: Diagnosis not present

## 2024-03-12 DIAGNOSIS — H832X9 Labyrinthine dysfunction, unspecified ear: Secondary | ICD-10-CM | POA: Diagnosis not present

## 2024-03-12 DIAGNOSIS — M5416 Radiculopathy, lumbar region: Secondary | ICD-10-CM | POA: Diagnosis not present

## 2024-03-12 DIAGNOSIS — R270 Ataxia, unspecified: Secondary | ICD-10-CM | POA: Diagnosis not present

## 2024-03-12 DIAGNOSIS — G609 Hereditary and idiopathic neuropathy, unspecified: Secondary | ICD-10-CM | POA: Diagnosis not present

## 2024-03-27 DIAGNOSIS — H353231 Exudative age-related macular degeneration, bilateral, with active choroidal neovascularization: Secondary | ICD-10-CM | POA: Diagnosis not present

## 2024-04-02 DIAGNOSIS — H401131 Primary open-angle glaucoma, bilateral, mild stage: Secondary | ICD-10-CM | POA: Diagnosis not present

## 2024-04-09 ENCOUNTER — Other Ambulatory Visit: Payer: Self-pay | Admitting: Cardiovascular Disease

## 2024-05-08 DIAGNOSIS — H353231 Exudative age-related macular degeneration, bilateral, with active choroidal neovascularization: Secondary | ICD-10-CM | POA: Diagnosis not present

## 2024-06-09 DIAGNOSIS — H401131 Primary open-angle glaucoma, bilateral, mild stage: Secondary | ICD-10-CM | POA: Diagnosis not present

## 2024-06-09 DIAGNOSIS — Z961 Presence of intraocular lens: Secondary | ICD-10-CM | POA: Diagnosis not present

## 2024-06-09 DIAGNOSIS — H2511 Age-related nuclear cataract, right eye: Secondary | ICD-10-CM | POA: Diagnosis not present

## 2024-06-21 ENCOUNTER — Other Ambulatory Visit: Payer: Self-pay | Admitting: Cardiovascular Disease

## 2024-06-26 DIAGNOSIS — H40003 Preglaucoma, unspecified, bilateral: Secondary | ICD-10-CM | POA: Diagnosis not present

## 2024-06-26 DIAGNOSIS — H353132 Nonexudative age-related macular degeneration, bilateral, intermediate dry stage: Secondary | ICD-10-CM | POA: Diagnosis not present

## 2024-06-26 DIAGNOSIS — H43811 Vitreous degeneration, right eye: Secondary | ICD-10-CM | POA: Diagnosis not present

## 2024-06-26 DIAGNOSIS — H353221 Exudative age-related macular degeneration, left eye, with active choroidal neovascularization: Secondary | ICD-10-CM | POA: Diagnosis not present

## 2024-06-26 DIAGNOSIS — H43821 Vitreomacular adhesion, right eye: Secondary | ICD-10-CM | POA: Diagnosis not present

## 2024-07-16 DIAGNOSIS — H43811 Vitreous degeneration, right eye: Secondary | ICD-10-CM | POA: Diagnosis not present

## 2024-07-16 DIAGNOSIS — H353211 Exudative age-related macular degeneration, right eye, with active choroidal neovascularization: Secondary | ICD-10-CM | POA: Diagnosis not present

## 2024-08-07 DIAGNOSIS — H40013 Open angle with borderline findings, low risk, bilateral: Secondary | ICD-10-CM | POA: Diagnosis not present

## 2024-08-07 DIAGNOSIS — H353221 Exudative age-related macular degeneration, left eye, with active choroidal neovascularization: Secondary | ICD-10-CM | POA: Diagnosis not present

## 2024-08-07 DIAGNOSIS — H25813 Combined forms of age-related cataract, bilateral: Secondary | ICD-10-CM | POA: Diagnosis not present

## 2024-08-07 DIAGNOSIS — H43821 Vitreomacular adhesion, right eye: Secondary | ICD-10-CM | POA: Diagnosis not present

## 2024-08-07 DIAGNOSIS — H43811 Vitreous degeneration, right eye: Secondary | ICD-10-CM | POA: Diagnosis not present

## 2024-09-11 DIAGNOSIS — R270 Ataxia, unspecified: Secondary | ICD-10-CM | POA: Diagnosis not present

## 2024-09-11 DIAGNOSIS — G609 Hereditary and idiopathic neuropathy, unspecified: Secondary | ICD-10-CM | POA: Diagnosis not present

## 2024-09-11 DIAGNOSIS — M5416 Radiculopathy, lumbar region: Secondary | ICD-10-CM | POA: Diagnosis not present

## 2024-09-12 DIAGNOSIS — H353221 Exudative age-related macular degeneration, left eye, with active choroidal neovascularization: Secondary | ICD-10-CM | POA: Diagnosis not present

## 2024-09-24 ENCOUNTER — Other Ambulatory Visit: Payer: Self-pay | Admitting: Cardiovascular Disease

## 2024-09-29 DIAGNOSIS — Z125 Encounter for screening for malignant neoplasm of prostate: Secondary | ICD-10-CM | POA: Diagnosis not present

## 2024-09-29 DIAGNOSIS — E785 Hyperlipidemia, unspecified: Secondary | ICD-10-CM | POA: Diagnosis not present

## 2024-09-29 DIAGNOSIS — Z1212 Encounter for screening for malignant neoplasm of rectum: Secondary | ICD-10-CM | POA: Diagnosis not present

## 2024-09-29 DIAGNOSIS — E291 Testicular hypofunction: Secondary | ICD-10-CM | POA: Diagnosis not present

## 2024-10-06 DIAGNOSIS — R82998 Other abnormal findings in urine: Secondary | ICD-10-CM | POA: Diagnosis not present

## 2024-10-10 DIAGNOSIS — H40013 Open angle with borderline findings, low risk, bilateral: Secondary | ICD-10-CM | POA: Diagnosis not present

## 2024-10-10 DIAGNOSIS — H43811 Vitreous degeneration, right eye: Secondary | ICD-10-CM | POA: Diagnosis not present

## 2024-10-10 DIAGNOSIS — H43821 Vitreomacular adhesion, right eye: Secondary | ICD-10-CM | POA: Diagnosis not present

## 2024-10-10 DIAGNOSIS — H353231 Exudative age-related macular degeneration, bilateral, with active choroidal neovascularization: Secondary | ICD-10-CM | POA: Diagnosis not present

## 2024-10-10 DIAGNOSIS — H25813 Combined forms of age-related cataract, bilateral: Secondary | ICD-10-CM | POA: Diagnosis not present

## 2024-11-21 DIAGNOSIS — H353231 Exudative age-related macular degeneration, bilateral, with active choroidal neovascularization: Secondary | ICD-10-CM | POA: Diagnosis not present
